# Patient Record
Sex: Female | Born: 1935 | Race: White | Hispanic: No | Marital: Married | Smoking: Never smoker
Health system: Southern US, Community
[De-identification: ages and names within clinical notes are randomized; demographics above are authoritative.]

## PROBLEM LIST (undated history)

## (undated) DIAGNOSIS — F0390 Unspecified dementia without behavioral disturbance: Secondary | ICD-10-CM

## (undated) DIAGNOSIS — C6932 Malignant neoplasm of left choroid: Secondary | ICD-10-CM

## (undated) DIAGNOSIS — I10 Essential (primary) hypertension: Secondary | ICD-10-CM

## (undated) DIAGNOSIS — I2510 Atherosclerotic heart disease of native coronary artery without angina pectoris: Secondary | ICD-10-CM

## (undated) DIAGNOSIS — E785 Hyperlipidemia, unspecified: Secondary | ICD-10-CM

## (undated) DIAGNOSIS — F325 Major depressive disorder, single episode, in full remission: Secondary | ICD-10-CM

## (undated) HISTORY — DX: Malignant neoplasm of left choroid: C69.32

## (undated) HISTORY — PX: JOINT REPLACEMENT: SHX530

## (undated) HISTORY — PX: CHOLECYSTECTOMY: SHX55

## (undated) HISTORY — DX: Hyperlipidemia, unspecified: E78.5

## (undated) HISTORY — DX: Atherosclerotic heart disease of native coronary artery without angina pectoris: I25.10

## (undated) HISTORY — DX: Unspecified dementia, unspecified severity, without behavioral disturbance, psychotic disturbance, mood disturbance, and anxiety: F03.90

## (undated) HISTORY — DX: Essential (primary) hypertension: I10

## (undated) HISTORY — DX: Major depressive disorder, single episode, in full remission: F32.5

---

## 2005-08-25 ENCOUNTER — Ambulatory Visit: Payer: Self-pay | Admitting: Internal Medicine

## 2005-08-26 ENCOUNTER — Ambulatory Visit: Payer: Self-pay

## 2005-09-06 ENCOUNTER — Ambulatory Visit: Payer: Self-pay

## 2006-06-28 ENCOUNTER — Other Ambulatory Visit: Payer: Self-pay

## 2006-06-28 ENCOUNTER — Emergency Department: Payer: Self-pay | Admitting: Emergency Medicine

## 2006-08-26 ENCOUNTER — Ambulatory Visit: Payer: Self-pay | Admitting: Internal Medicine

## 2006-08-30 ENCOUNTER — Ambulatory Visit: Payer: Self-pay | Admitting: Internal Medicine

## 2006-10-22 ENCOUNTER — Other Ambulatory Visit: Payer: Self-pay

## 2006-10-22 ENCOUNTER — Inpatient Hospital Stay: Payer: Self-pay | Admitting: Internal Medicine

## 2007-09-20 IMAGING — CR DG CHEST 1V PORT
1 series · 1 of 1 positions shown · non-contrast
Comparison: none

REASON FOR EXAM: Chest Pain
COMMENTS:

PROCEDURE:     DXR - DXR PORTABLE CHEST SINGLE VIEW  - June 28, 2006  [DATE]
RESULT:        A single portable view of the chest shows cardiac monitoring
electrodes are present.  The lungs are clear.  The heart and pulmonary
vessels are normal.  The bony and mediastinal structures are unremarkable.

[view not recorded]
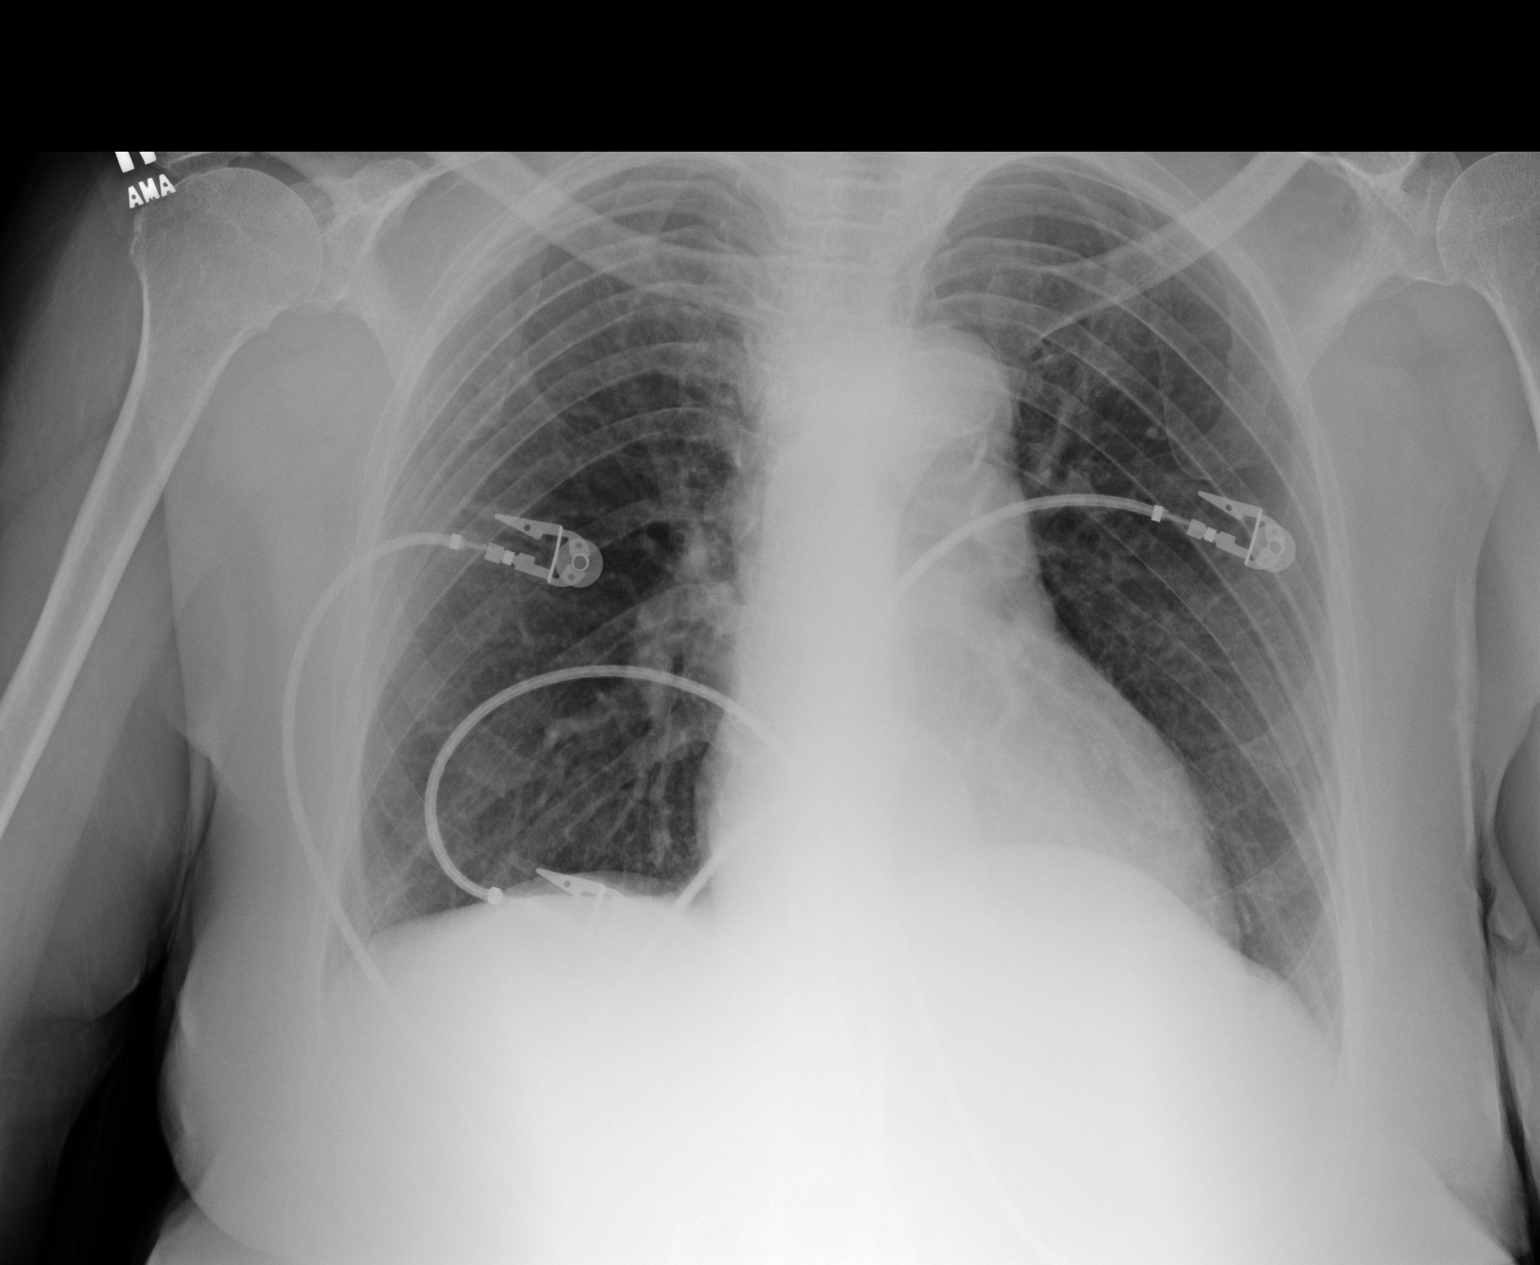

[1 of 1 positions shown; findings below may reference images not displayed]

IMPRESSION: No acute cardiopulmonary disease.

## 2007-11-22 IMAGING — US ULTRASOUND RIGHT BREAST
1 series · 17 of 25 positions shown · non-contrast
Comparison: none

REASON FOR EXAM: density  Poss US
COMMENTS:

[Series 1: ultrasound right breast · 17 of 44 slices shown]
[im 1/44]
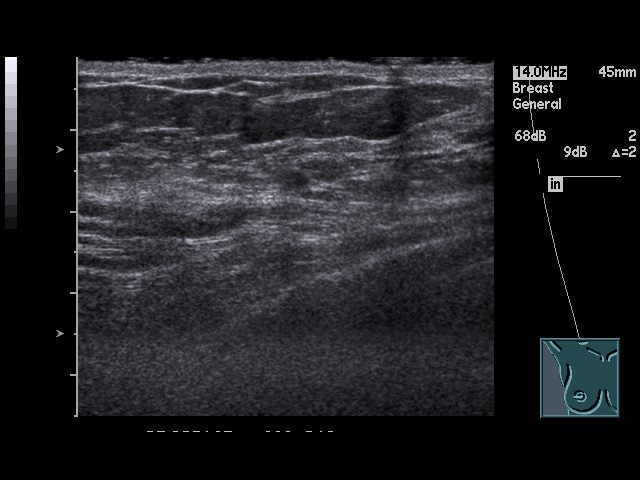
[im 4/44]
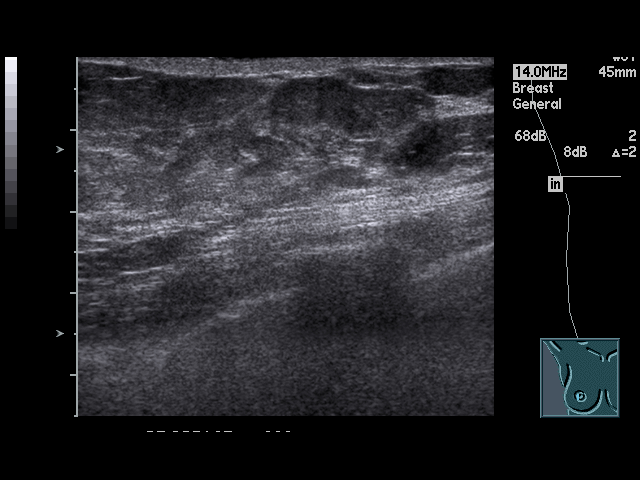
[im 6/44]
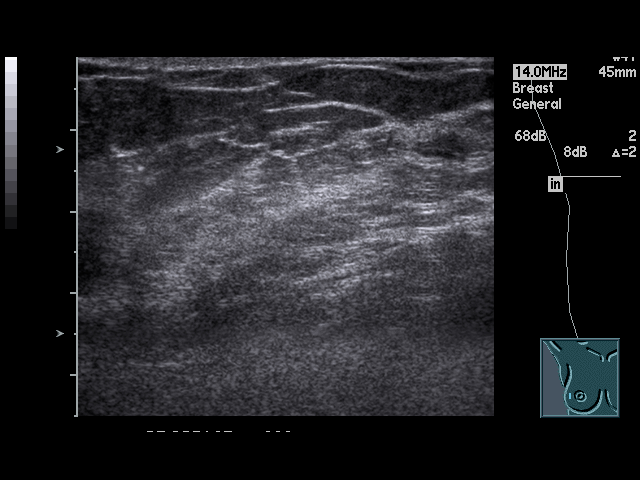
[im 9/44]
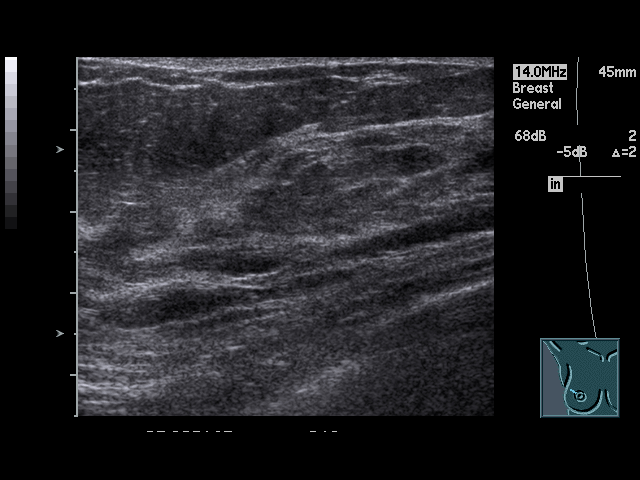
[im 11/44]
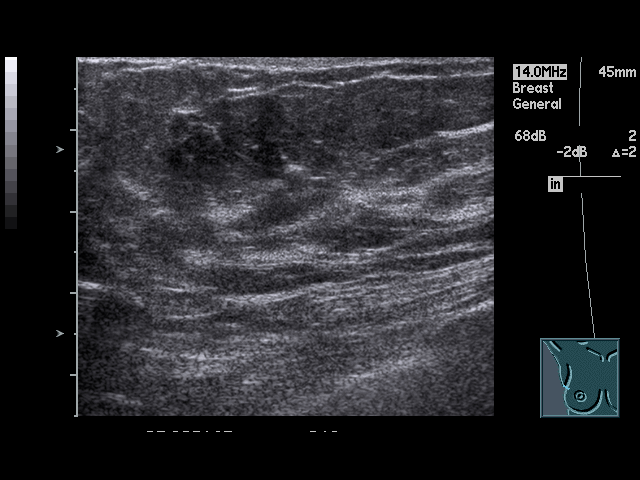
[im 15/44]
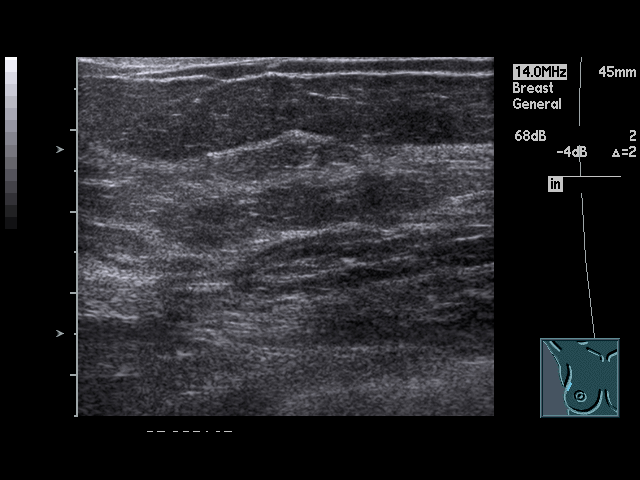
[im 17/44]
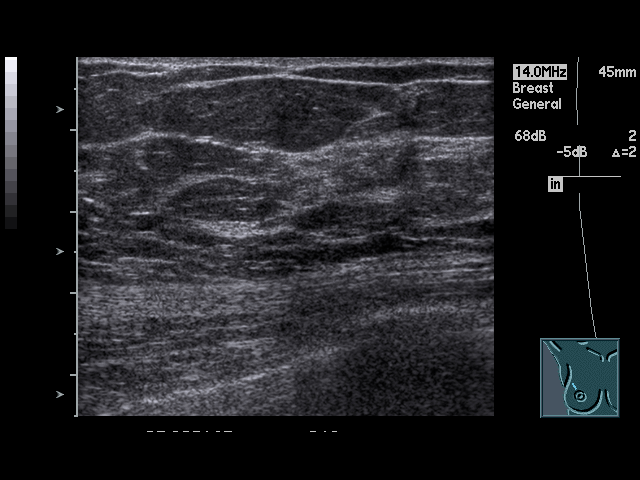
[im 20/44]
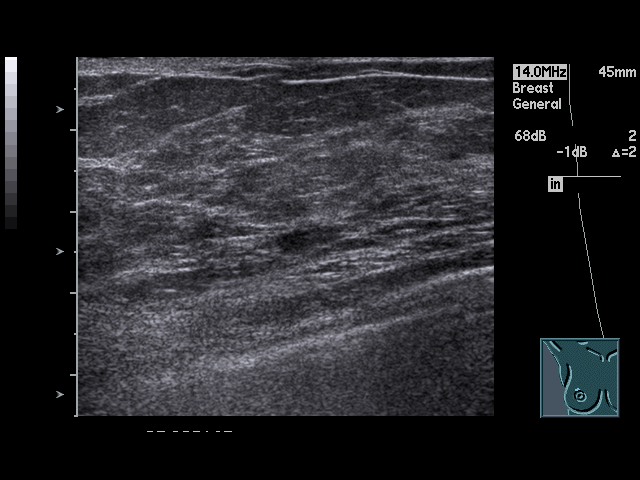
[im 22/44]
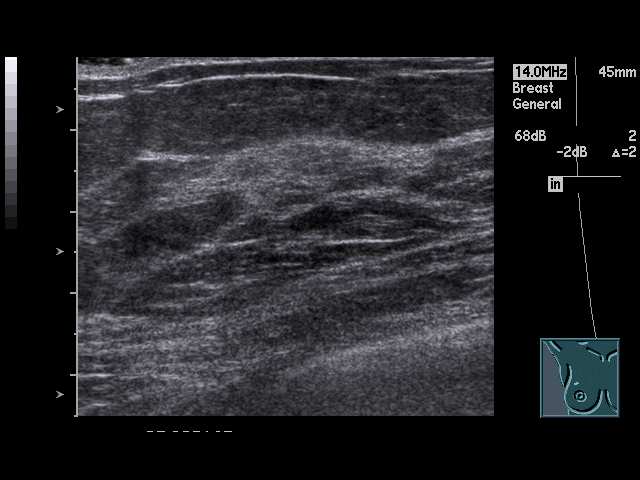
[im 24/44]
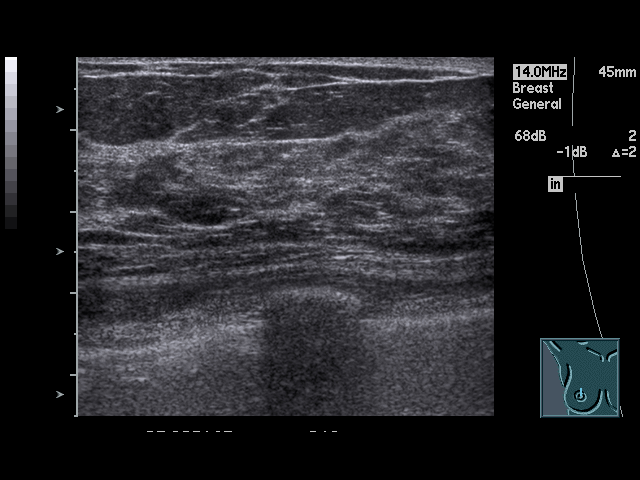
[im 27/44]
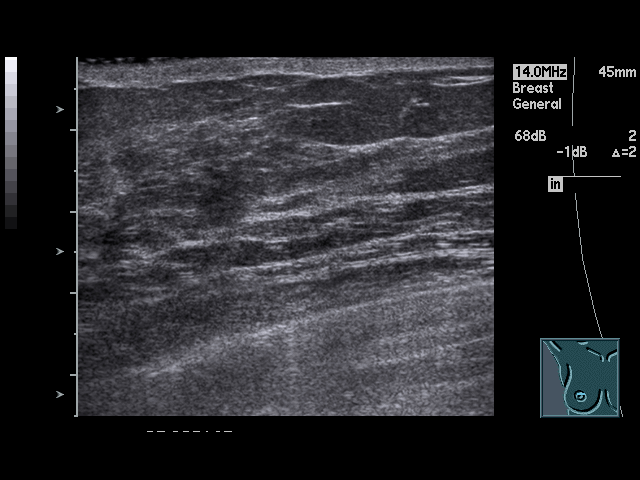
[im 29/44]
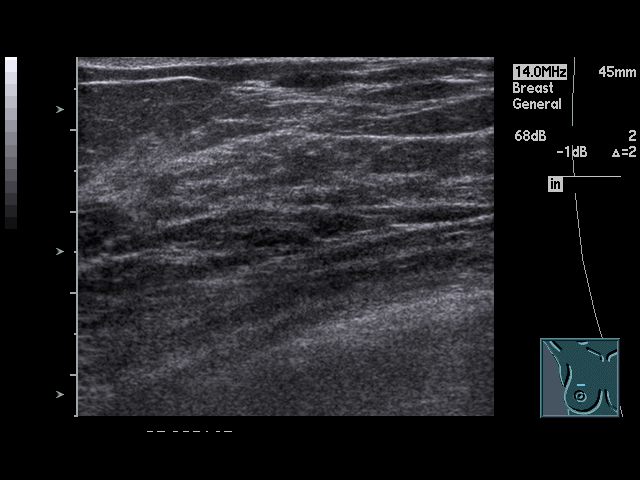
[im 33/44]
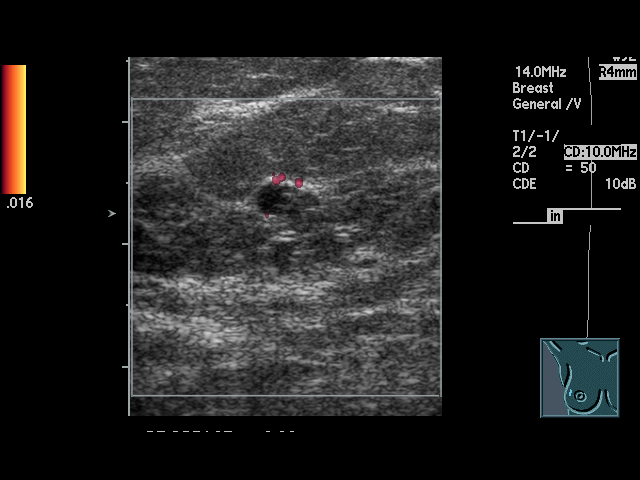
[im 35/44]
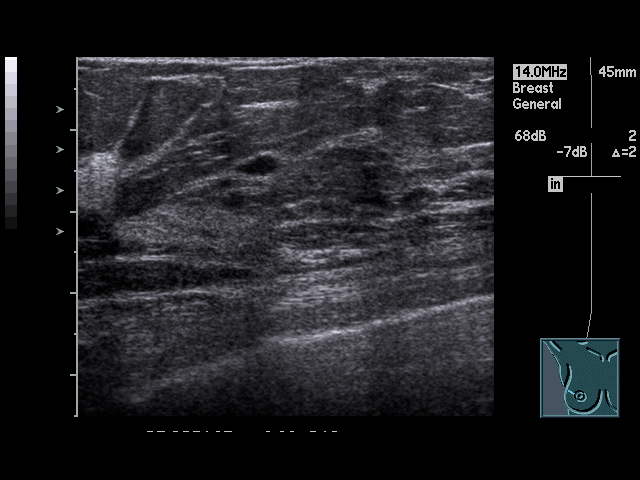
[im 38/44]
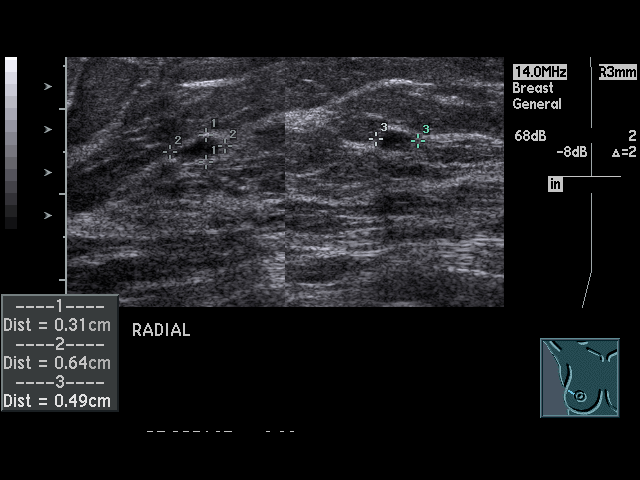
[im 40/44]
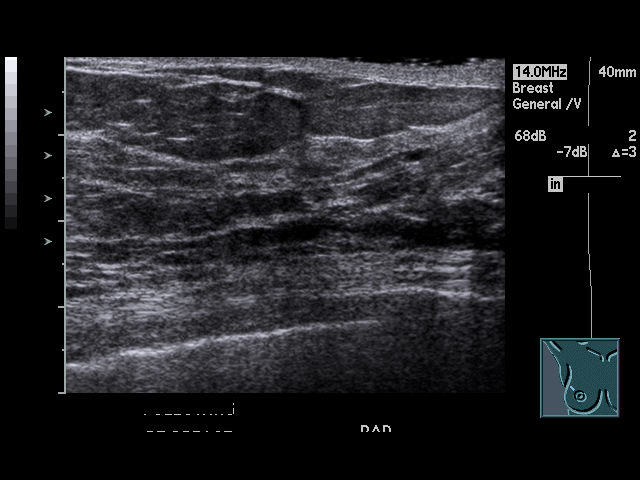
[im 44/44]
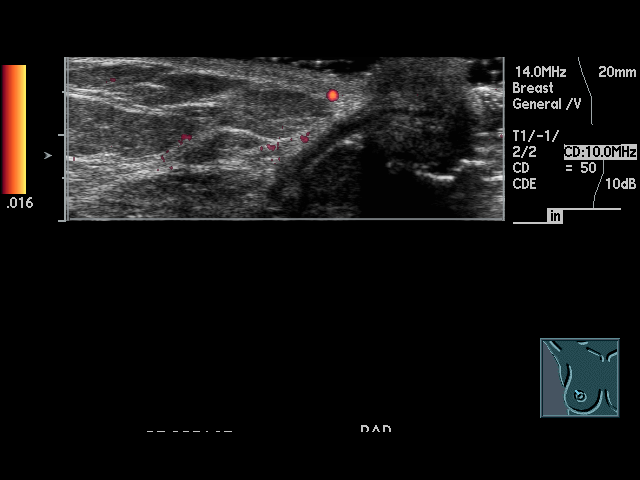

[17 of 25 positions shown; findings below may reference images not displayed]

PROCEDURE:     US  - US BREAST RIGHT  - August 30, 2006 [DATE]

RESULT:     The RIGHT breast was evaluated in the region of interest from
the 9 o'clock to 12 o'clock position. At the [DATE] position a hypoechoic
nodule is appreciated measuring .31 x .64 x .49 cm.  There are areas of
peripheral flow and mild wall irregularity.  There does appear to be an
element of mild acoustic shadowing. This nodule has suspicious ultrasound
findings and further evaluation with surgical consultation is recommended.
An area of what appears to be benign ductal ectasia is appreciated at the 10
o'clock position.
IMPRESSION: 1)Suspicious nodule within the region of interest at the [DATE] position of
the RIGHT breast, surgical consultation recommended.  Please refer to the
radiographic view dictation for completed discussion.

## 2008-03-18 ENCOUNTER — Observation Stay: Payer: Self-pay | Admitting: Specialist

## 2008-12-28 ENCOUNTER — Ambulatory Visit: Payer: Self-pay | Admitting: Internal Medicine

## 2009-03-14 ENCOUNTER — Ambulatory Visit: Payer: Self-pay | Admitting: Internal Medicine

## 2009-04-22 ENCOUNTER — Inpatient Hospital Stay: Payer: Self-pay | Admitting: Internal Medicine

## 2009-04-27 ENCOUNTER — Encounter: Payer: Self-pay | Admitting: Internal Medicine

## 2009-05-07 ENCOUNTER — Encounter: Payer: Self-pay | Admitting: Internal Medicine

## 2009-06-06 ENCOUNTER — Encounter: Payer: Self-pay | Admitting: Internal Medicine

## 2009-08-27 ENCOUNTER — Ambulatory Visit: Payer: Self-pay | Admitting: Internal Medicine

## 2009-08-27 ENCOUNTER — Inpatient Hospital Stay: Payer: Self-pay | Admitting: Internal Medicine

## 2009-12-27 ENCOUNTER — Inpatient Hospital Stay: Payer: Self-pay | Admitting: Internal Medicine

## 2010-02-19 ENCOUNTER — Ambulatory Visit: Payer: Self-pay | Admitting: Internal Medicine

## 2010-11-18 IMAGING — CR DG CHEST 1V PORT
1 series · 1 of 1 positions shown · non-contrast
Comparison: none

REASON FOR EXAM: chest pain
COMMENTS:

[view not recorded]
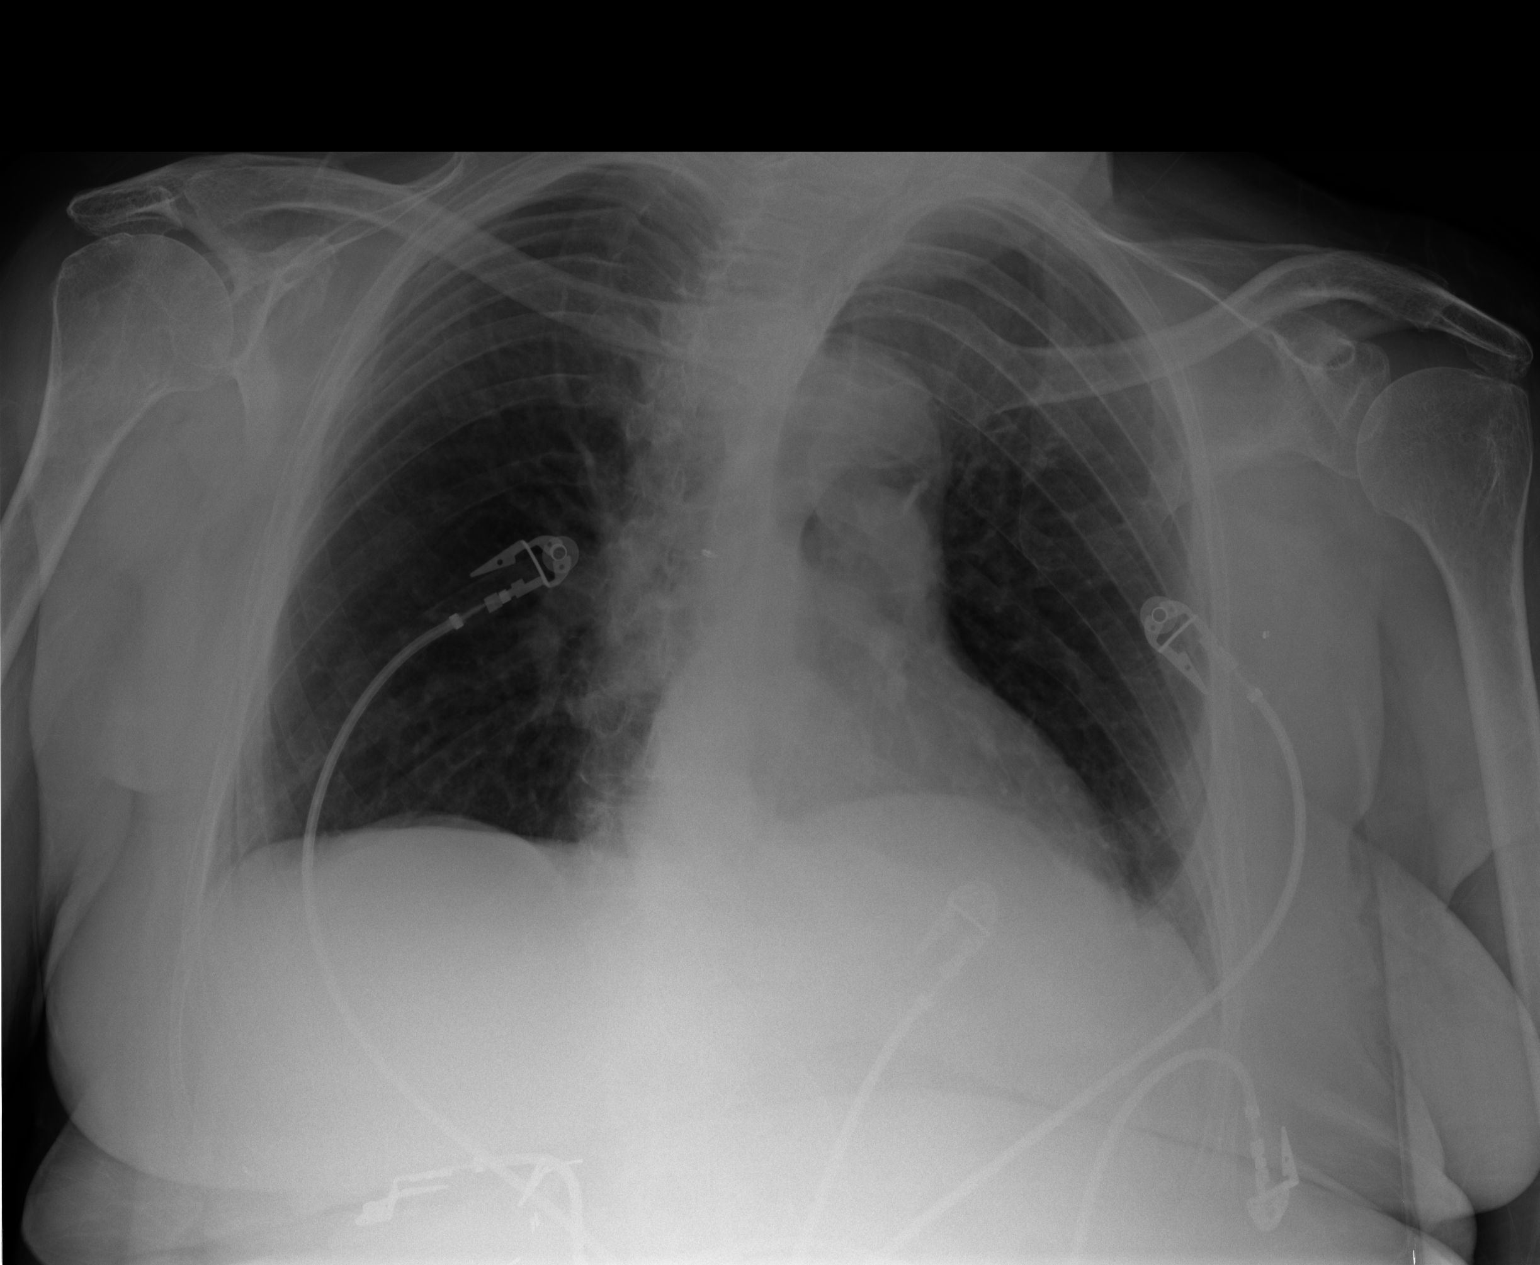

[1 of 1 positions shown; findings below may reference images not displayed]

PROCEDURE:     DXR - DXR PORTABLE CHEST SINGLE VIEW  - August 26, 2009 [DATE]

RESULT:     Comparison is made to prior study dated 04/22/2009.

The patient has taken a shallow inspiration. No focal regions of
consolidation are appreciated. The cardiac silhouette is within the upper
limits of normal. The visualized skeleton is unremarkable.
IMPRESSION: 1. Shallow inspiration without evidence of acute cardiopulmonary disease.

## 2011-08-11 ENCOUNTER — Other Ambulatory Visit: Payer: Self-pay | Admitting: *Deleted

## 2011-08-11 MED ORDER — INSULIN LISPRO 100 UNIT/ML ~~LOC~~ SOLN: SUBCUTANEOUS | Status: DC

## 2011-08-12 ENCOUNTER — Other Ambulatory Visit: Payer: Self-pay | Admitting: Internal Medicine

## 2011-08-12 MED ORDER — INSULIN LISPRO PROT & LISPRO (75-25 MIX) 100 UNIT/ML ~~LOC~~ SUSP: 38.0000 [IU] | Freq: Every day | SUBCUTANEOUS | Status: DC

## 2011-09-25 ENCOUNTER — Other Ambulatory Visit: Payer: Self-pay | Admitting: Internal Medicine

## 2011-09-25 MED ORDER — LORAZEPAM 0.5 MG PO TABS: 0.5000 mg | ORAL_TABLET | Freq: Two times a day (BID) | ORAL | Status: DC

## 2011-09-25 NOTE — Telephone Encounter (Signed)
Needs one Rx to go to [facility], one to go to [facility] and fax the one that was sent to [facility] to [facility] home at Fax # [phone]

## 2011-10-15 ENCOUNTER — Encounter: Payer: Self-pay | Admitting: Internal Medicine

## 2011-10-26 ENCOUNTER — Other Ambulatory Visit: Payer: Self-pay | Admitting: Internal Medicine

## 2012-02-29 ENCOUNTER — Telehealth: Payer: Self-pay | Admitting: *Deleted

## 2012-02-29 NOTE — Telephone Encounter (Signed)
Triage Record Num: [id number] Operator: Sula Rumple Patient Name: Theresa Hood Call Date & Time: 02/27/2012 6:01:04PM Patient Phone: [phone] PCP: Patient Gender: Female PCP Fax : Patient DOB: [birth year] Practice Name: [facility] Reason for Call: Caller: Fulton Reek; PCP: Duncan Dull; CB#: [phone]; Call regarding Blood sugar issues; Med Tech/Lucille calling from [facility] on 02/27/12 states pt had a BS of 109 @ 1730/ BS was 65 prior to dinner/was given Juice/peanut butter crackers/and BS increased to 109/ rechecked at 1804/MedTech is asking if pt should be given Novulin 70/30 16 at dinner per regular order/ BS at present is 150/pt has not eaten dinner/asking if she should give Insulin/advised to give medication as ordered and to recheck BS at HS/all emergent sx ruled out per Diabetes Control Problems Protocol Protocol(s) Used: Diabetes: Control Problems Recommended Outcome per Protocol: Provide Home/Self Care Reason for Outcome: Signs/symptoms of hypogylcemia AND responds to action plan as defined by provider Care Advice: Diabetes Action Plan: - Follow recommended action plan and diet - Check blood sugar as recommended - Take diabetic pills or insulin as ordered - Follow action plan for illness. ~ 03/

## 2012-03-14 ENCOUNTER — Telehealth: Payer: Self-pay | Admitting: Internal Medicine

## 2012-03-14 NOTE — Telephone Encounter (Signed)
[facility] was advised to call 911

## 2012-03-14 NOTE — Telephone Encounter (Signed)
I assume she was taken to the ER ?

## 2012-03-14 NOTE — Telephone Encounter (Signed)
[facility] Triage Call Report Triage Record Num: [id number] Operator: Arline Asp Loftin Patient Name: Theresa Hood Call Date & Time: 03/14/2012 2:24:24PM Patient Phone: [phone] PCP: Duncan Dull Patient Gender: Female PCP Fax : [phone] Patient DOB: [birth year] Practice Name: [facility] Reason for Call: Caller: Associate Professor with [facility]; PCP: Duncan Dull; CB#: [phone]; ; ; Call regarding [name] Calling From Assisted Living Said the Patient s Sugar Is High and She Is Dragging Her Right Leg. Pt's BG of 280 before lunch, took SS as ordered. BG has not been checked after lunch. Has been sleepier than her normal. Noticed dragging of her right leg on 04/08. Pt is a DNR. Advised to notify family, as per "Weakness/Paralysis Protocol" disp is 911. Will also recheck BG to make sure BG is less than 250 after her SS insulin. Protocol(s) Used: Weakness or Paralysis Recommended Outcome per Protocol: Activate EMS 911 Reason for Outcome: Acute onset of weakness or paralysis associated with loss of coordination (purposeful action) or numbness/tingling of any part of the body Care Advice: Write down provider's name. List or place the following in a bag for transport with the patient: current prescription and/or nonprescription medications; alternative treatments, therapies and medications; and street drugs. ~ 03/14/2012 2:32:59PM Page 1 of 1 [email or web address]

## 2012-03-15 NOTE — Telephone Encounter (Signed)
I called [facility] asking for Theresa Hood, a man answered who stated Theresa Hood was not available at this time and asked what I was calling about.  I said I wanted to make sure they called EMS on the patient yesterday, he yelled in the background and asked someone else if they did call and then he got back on the phone and stated they did not call EMS.   I asked him if Theresa Hood would call me back asap.

## 2012-03-15 NOTE — Telephone Encounter (Signed)
Please  tell them to make an appt with me then

## 2012-03-16 ENCOUNTER — Encounter: Payer: Self-pay | Admitting: Internal Medicine

## 2012-03-16 NOTE — Telephone Encounter (Signed)
Letter has been sent to [facility]

## 2012-03-16 NOTE — Telephone Encounter (Signed)
I called [facility] they can transport patient to an office visit next Wednesday.  Theresa Hood stated patient is doing much better, she stated they called her son and he came to see patient.  The only thing Theresa Hood wanted to know if you could take care of before patients appt Wednesday is to D/C Boost.  Theresa Hood stated she thinks that is what is raising patients blood sugar.  Please advise.

## 2012-03-16 NOTE — Telephone Encounter (Signed)
Yes, dc boost.  Glucerna is a good alternative.   Letter printed.

## 2012-03-23 ENCOUNTER — Ambulatory Visit: Payer: Self-pay | Admitting: Internal Medicine

## 2012-03-28 ENCOUNTER — Encounter: Payer: Self-pay | Admitting: Internal Medicine

## 2012-03-28 ENCOUNTER — Ambulatory Visit (INDEPENDENT_AMBULATORY_CARE_PROVIDER_SITE_OTHER): Payer: Medicare Other | Admitting: Internal Medicine

## 2012-03-28 VITALS — BP 132/76 | HR 80 | Temp 98.1°F | Resp 14 | Wt 164.5 lb

## 2012-03-28 DIAGNOSIS — E1122 Type 2 diabetes mellitus with diabetic chronic kidney disease: Secondary | ICD-10-CM | POA: Insufficient documentation

## 2012-03-28 DIAGNOSIS — F0390 Unspecified dementia without behavioral disturbance: Secondary | ICD-10-CM | POA: Insufficient documentation

## 2012-03-28 DIAGNOSIS — R5383 Other fatigue: Secondary | ICD-10-CM

## 2012-03-28 DIAGNOSIS — E785 Hyperlipidemia, unspecified: Secondary | ICD-10-CM

## 2012-03-28 DIAGNOSIS — I2510 Atherosclerotic heart disease of native coronary artery without angina pectoris: Secondary | ICD-10-CM

## 2012-03-28 DIAGNOSIS — N183 Chronic kidney disease, stage 3 unspecified: Secondary | ICD-10-CM | POA: Insufficient documentation

## 2012-03-28 DIAGNOSIS — E1165 Type 2 diabetes mellitus with hyperglycemia: Secondary | ICD-10-CM

## 2012-03-28 DIAGNOSIS — F325 Major depressive disorder, single episode, in full remission: Secondary | ICD-10-CM | POA: Insufficient documentation

## 2012-03-28 DIAGNOSIS — F068 Other specified mental disorders due to known physiological condition: Secondary | ICD-10-CM

## 2012-03-28 DIAGNOSIS — I10 Essential (primary) hypertension: Secondary | ICD-10-CM | POA: Insufficient documentation

## 2012-03-28 DIAGNOSIS — R5381 Other malaise: Secondary | ICD-10-CM

## 2012-03-28 LAB — COMPREHENSIVE METABOLIC PANEL
Albumin: 4.1 g/dL (ref 3.5–5.2)
BUN: 23 mg/dL (ref 6–23)
Chloride: 110 mEq/L (ref 96–112)
GFR: 31.68 mL/min — ABNORMAL LOW (ref >60.00)
Glucose, Bld: 105 mg/dL — ABNORMAL HIGH (ref 70–99)
Potassium: 3.9 mEq/L (ref 3.5–5.1)
Total Bilirubin: 0.2 mg/dL — ABNORMAL LOW (ref 0.3–1.2)
Total Protein: 7.1 g/dL (ref 6.0–8.3)

## 2012-03-28 LAB — HEMOGLOBIN A1C: Hgb A1c MFr Bld: 6.2 % (ref 4.6–6.5)

## 2012-03-28 NOTE — Patient Instructions (Signed)
We are switching yoru supplemental drink from glucerna to [employer]  Return in 3 months

## 2012-03-28 NOTE — Assessment & Plan Note (Signed)
Given her age and lost to followup I elected not last year to stop her statin therapy.

## 2012-03-28 NOTE — Assessment & Plan Note (Signed)
And with a markedly intact long-term memory and good preservation of social skills.. Continue Aricept.

## 2012-03-28 NOTE — Assessment & Plan Note (Addendum)
Managed with 70/30 insulin twice daily and short acting using sliding scale at lunchtime. A1c is due today. It is unclear why she is not on an ACE inhibitor given her history of coronary artery disease records have been requested from prior practice. She is now tolerating the Glucerna nutritional supplement. Will Will switch to [employer] breakfast drink for taste preference.

## 2012-03-28 NOTE — Progress Notes (Signed)
Patient ID: Theresa Hood, female   DOB: 08-10-1925, 78 y.o.   MRN: [id number]  Patient Active Problem List  Diagnoses  . Diabetes mellitus with stage 1 chronic kidney disease  . Hyperlipidemia  . Coronary artery disease  . Depression, major, in remission  . Dementia arising in the senium and presenium    Subjective:  CC:   Chief Complaint  Patient presents with  . Follow-up    HPI:   Theresa Hood is a 78 y.o. female who presents  For the first tinme in over two years for followup on diabetes, dementia and hyperlipidemia,  She has been lost to follow up since placement in A/L facility ([facility]) .   Recent reports of dragging her right leg and having uncontrolled blood sugars.  Was not taken to the Er since family evaluated her and found to be at her baseline.  Nutritional supplement was changed from boost to Glucerna  and she hates the taste of t. She denies headaches back pain and leg pain. He denies nausea but admits that her appetite is poor because she does not get hungry often and often skips meals. Her blood sugars are checked 3 times daily and her average morning blood sugar is 140,  her average pre-lunch blood sugar is averaging 145 and her average pre-dinner blood sugar is averaging 160 she has had no lows in the last 3 weeks. Her insulin regimen is 38 units of 70/30/ and in the morning with breakfast and 16 units with dinner she gets a additional 5 units at lunch of Humulin R for blood sugars greater than 150.   this has been necessary approximately 6 times in the last 3 weeks.    Past Medical History  Diagnosis Date  . Diabetes mellitus   . Hypertension   . Hyperlipidemia   . Coronary artery disease   . Melanoma, choroid, left eye     s/p enucleation  . Depression, major, in remission   . Dementia arising in the senium and presenium     Past Surgical History  Procedure Date  . Joint replacement     total left knee  . Cholecystectomy          The following  portions of the patient's history were reviewed and updated as appropriate: Allergies, current medications, and problem list.    Review of Systems:   12 Pt  review of systems was negative except those addressed in the HPI,     History   Social History  . Marital Status: Married    Spouse Name: N/A    Number of Children: N/A  . Years of Education: N/A   Occupational History  . Not on file.   Social History Main Topics  . Smoking status: Never Smoker   . Smokeless tobacco: Never Used  . Alcohol Use: No  . Drug Use: No  . Sexually Active: Not on file   Other Topics Concern  . Not on file   Social History Narrative  . No narrative on file    Objective:  BP 132/76  Pulse 80  Temp(Src) 98.1 F (36.7 C) (Oral)  Resp 14  Wt 164 lb 8 oz (74.617 kg)  SpO2 93%  General appearance: alert, cooperative and appears stated age Ears: normal TM's and external ear canals both ears Throat: lips, mucosa, and tongue normal; teeth and gums normal Neck: no adenopathy, no carotid bruit, supple, symmetrical, trachea midline and thyroid not enlarged, symmetric, no tenderness/mass/nodules Back: symmetric, no curvature.  ROM normal. No CVA tenderness. Lungs: clear to auscultation bilaterally Heart: regular rate and rhythm, S1, S2 normal, no murmur, click, rub or gallop Abdomen: soft, non-tender; bowel sounds normal; no masses,  no organomegaly Pulses: 2+ and symmetric Skin: Skin color, texture, turgor normal. No rashes or lesions Lymph nodes: Cervical, supraclavicular, and axillary nodes normal.  Assessment and Plan:  Coronary artery disease Asymptomatic. Continue Plavix aspirin statin.  Dementia arising in the senium and presenium And with a markedly intact long-term memory and good preservation of social skills.. Continue Aricept.  Diabetes mellitus with stage 1 chronic kidney disease Managed with 70/30 insulin twice daily and short acting using sliding scale at lunchtime. A1c  is due today. It is unclear why she is not on an ACE inhibitor given her history of coronary artery disease records have been requested from prior practice. She is now tolerating the Glucerna nutritional supplement. Will Will switch to [employer] breakfast drink for taste preference.  Hyperlipidemia Given her age and lost to followup I elected not last year to stop her statin therapy.    Updated Medication List Outpatient Encounter Prescriptions as of 03/28/2012  Medication Sig Dispense Refill  . acetaminophen (TYLENOL) 325 MG tablet Take 650 mg by mouth every 6 (six) hours as needed.      Marland Kitchen amoxicillin (AMOXIL) 500 MG capsule Take 2,000 mg by mouth. Prior to dental appts.      . benzonatate (TESSALON) 200 MG capsule Take 200 mg by mouth 3 (three) times daily as needed.      . clopidogrel (PLAVIX) 75 MG tablet Take 75 mg by mouth daily.      Marland Kitchen donepezil (ARICEPT) 10 MG tablet Take 10 mg by mouth at bedtime.      . ergocalciferol (VITAMIN D2) 50000 UNITS capsule Take 50,000 Units by mouth once a week.      . fexofenadine (ALLEGRA) 60 MG tablet Take 60 mg by mouth daily as needed.      Marland Kitchen guaifenesin (ROBITUSSIN) 100 MG/5ML syrup Take 10 mLs by mouth every 6 (six) hours as needed.      . insulin lispro (HUMALOG) 100 UNIT/ML injection Inject into the skin. Sliding Scale only lunch and supper:  200-300=10 units, 301-400=15 units, Greater than 400=20 units      . insulin NPH-insulin regular (NOVOLIN 70/30) (70-30) 100 UNIT/ML injection Inject 38 Units into the skin daily with breakfast. Inject 16 units with dinner      . loperamide (IMODIUM) 2 MG capsule Take 2 mg by mouth 4 (four) times daily as needed.      Marland Kitchen LORazepam (ATIVAN) 0.5 MG tablet Take 0.5 mg by mouth at bedtime.      . magnesium hydroxide (MILK OF MAGNESIA) 400 MG/5ML suspension Take 30 mLs by mouth daily as needed.      . meloxicam (MOBIC) 7.5 MG tablet Take 7.5 mg by mouth daily.      . mirtazapine (REMERON) 15 MG tablet Take  15 mg by mouth at bedtime.      Bertram Gala Glycol-Propyl Glycol (SYSTANE OP) Apply to eye.      . promethazine (PHENERGAN) 12.5 MG tablet Take 12.5 mg by mouth every 8 (eight) hours as needed.      . ranitidine (ZANTAC) 150 MG tablet Take 150 mg by mouth daily.      Marland Kitchen DISCONTD: LORazepam (ATIVAN) 0.5 MG tablet Take 1 tablet (0.5 mg total) by mouth 2 (two) times daily.  60 tablet  0  . DISCONTD:  feeding supplement (BOOST HIGH PROTEIN) LIQD Take 1 Container by mouth 2 (two) times daily.      Marland Kitchen DISCONTD: insulin lispro protamine-insulin lispro (HUMALOG 75/25) (75-25) 100 UNIT/ML SUSP Inject 38 Units into the skin daily with breakfast. 16 units with dinner.  10 mL  12     Orders Placed This Encounter  Procedures  . Comprehensive metabolic panel  . Hemoglobin A1c  . TSH  . Direct LDL    Return in about 3 months (around 06/27/2012).

## 2012-03-28 NOTE — Assessment & Plan Note (Signed)
Asymptomatic. Continue Plavix aspirin statin.

## 2012-03-29 LAB — TSH: TSH: 0.86 u[IU]/mL (ref 0.35–5.50)

## 2012-03-31 ENCOUNTER — Other Ambulatory Visit: Payer: Self-pay | Admitting: *Deleted

## 2012-04-01 ENCOUNTER — Telehealth: Payer: Self-pay | Admitting: Internal Medicine

## 2012-04-01 MED ORDER — LORAZEPAM 0.5 MG PO TABS: 0.5000 mg | ORAL_TABLET | Freq: Every day | ORAL | Status: DC

## 2012-04-01 NOTE — Telephone Encounter (Signed)
Have her try the other glucerna flavors.  If she doesn't like it ,  The facility will need to call the family and have them supply the carnation packets.

## 2012-04-01 NOTE — Telephone Encounter (Signed)
The Pharmacy called and stated they can not get Carn[employer] Breakfast drink because they do not carry it.  I advised the reason patient did not want Glucerna is because she did not like the taste.   The only options she has is patient can try the other flavors to see if she likes them better, or the family can provide the Carnation drinks instead.  They did not have an alternative for diabetic drink.  Please advise on what should be done.

## 2012-04-04 ENCOUNTER — Encounter: Payer: Self-pay | Admitting: Internal Medicine

## 2012-04-04 NOTE — Telephone Encounter (Signed)
Letter has been sent to [facility]

## 2012-04-07 ENCOUNTER — Other Ambulatory Visit: Payer: Self-pay | Admitting: Internal Medicine

## 2012-04-11 ENCOUNTER — Other Ambulatory Visit: Payer: Self-pay | Admitting: Internal Medicine

## 2012-04-11 MED ORDER — LORAZEPAM 0.5 MG PO TABS: 0.5000 mg | ORAL_TABLET | Freq: Every day | ORAL | Status: DC

## 2012-05-03 ENCOUNTER — Other Ambulatory Visit: Payer: Self-pay | Admitting: Internal Medicine

## 2012-05-03 MED ORDER — LOPERAMIDE HCL 2 MG PO CAPS: 4.0000 mg | ORAL_CAPSULE | ORAL | Status: DC | PRN

## 2012-09-22 ENCOUNTER — Ambulatory Visit (INDEPENDENT_AMBULATORY_CARE_PROVIDER_SITE_OTHER): Payer: Medicare Other | Admitting: Internal Medicine

## 2012-09-22 ENCOUNTER — Encounter: Payer: Self-pay | Admitting: Internal Medicine

## 2012-09-22 VITALS — BP 136/74 | HR 84 | Temp 98.7°F | Ht 64.5 in | Wt 155.5 lb

## 2012-09-22 DIAGNOSIS — R634 Abnormal weight loss: Secondary | ICD-10-CM

## 2012-09-22 DIAGNOSIS — R5383 Other fatigue: Secondary | ICD-10-CM

## 2012-09-22 DIAGNOSIS — E1129 Type 2 diabetes mellitus with other diabetic kidney complication: Secondary | ICD-10-CM

## 2012-09-22 DIAGNOSIS — E785 Hyperlipidemia, unspecified: Secondary | ICD-10-CM

## 2012-09-22 DIAGNOSIS — R350 Frequency of micturition: Secondary | ICD-10-CM

## 2012-09-22 DIAGNOSIS — Z23 Encounter for immunization: Secondary | ICD-10-CM

## 2012-09-22 DIAGNOSIS — E1122 Type 2 diabetes mellitus with diabetic chronic kidney disease: Secondary | ICD-10-CM

## 2012-09-22 DIAGNOSIS — E119 Type 2 diabetes mellitus without complications: Secondary | ICD-10-CM

## 2012-09-22 DIAGNOSIS — N181 Chronic kidney disease, stage 1: Secondary | ICD-10-CM

## 2012-09-22 LAB — COMPREHENSIVE METABOLIC PANEL
ALT: 10 U/L (ref 0–35)
BUN: 22 mg/dL (ref 6–23)
CO2: 27 mEq/L (ref 19–32)
Calcium: 9.2 mg/dL (ref 8.4–10.5)
Chloride: 107 mEq/L (ref 96–112)
Creatinine, Ser: 1.4 mg/dL — ABNORMAL HIGH (ref 0.4–1.2)
GFR: 38.62 mL/min — ABNORMAL LOW (ref >60.00)

## 2012-09-22 LAB — CBC WITH DIFFERENTIAL/PLATELET
Basophils Absolute: 0 10*3/uL (ref 0.0–0.1)
Basophils Relative: 0.4 % (ref 0.0–3.0)
Hemoglobin: 11.4 g/dL — ABNORMAL LOW (ref 12.0–15.0)
Lymphocytes Relative: 29 % (ref 12.0–46.0)
Monocytes Relative: 10.2 % (ref 3.0–12.0)
Neutro Abs: 3.6 10*3/uL (ref 1.4–7.7)
RBC: 4.87 Mil/uL (ref 3.87–5.11)

## 2012-09-22 LAB — HEMOGLOBIN A1C: Hgb A1c MFr Bld: 6.5 % (ref 4.6–6.5)

## 2012-09-22 NOTE — Progress Notes (Signed)
Patient ID: Theresa Hood, female   DOB: 1925/11/08, 78 y.o.   MRN: [id number]  Patient Active Problem List  Diagnosis  . Diabetes mellitus with stage 1 chronic kidney disease  . Hyperlipidemia  . Coronary artery disease  . Depression, major, in remission  . Dementia arising in the senium and presenium    Subjective:  CC:   Chief Complaint  Patient presents with  . Urinary Frequency    HPI:   Theresa Partinis a 78 y.o. female who presents for 6 month follow up on chronic problems, including insulin controlled diabetes and dementia.  She has been having urinary frequency, but denies burning.  She also  reports frequent insomnia., doesn't particularly like being at [facility] (has been there for years) .  He denies significant arm pain except arm whe.re flu shot was given.  Bowels moving regularly.  Does not participate in exercise because "I'm lazy."  No nausea,  Appetite ok.  UA and culture done yesterday at [facility].   Past Medical History  Diagnosis Date  . Diabetes mellitus   . Hypertension   . Hyperlipidemia   . Coronary artery disease   . Melanoma, choroid, left eye     s/p enucleation  . Depression, major, in remission   . Dementia arising in the senium and presenium     Past Surgical History  Procedure Date  . Joint replacement     total left knee  . Cholecystectomy          The following portions of the patient's history were reviewed and updated as appropriate: Allergies, current medications, and problem list.    Review of Systems:   12 Pt  review of systems was negative except those addressed in the HPI,     History   Social History  . Marital Status: Married    Spouse Name: N/A    Number of Children: N/A  . Years of Education: N/A   Occupational History  . Not on file.   Social History Main Topics  . Smoking status: Never Smoker   . Smokeless tobacco: Never Used  . Alcohol Use: No  . Drug Use: No  . Sexually Active: Not on file    Other Topics Concern  . Not on file   Social History Narrative  . No narrative on file    Objective:  BP 136/74  Pulse 84  Temp 98.7 F (37.1 C) (Oral)  Ht 5' 4.5" (1.638 m)  Wt 155 lb 8 oz (70.534 kg)  BMI 26.28 kg/m2  SpO2 97%  General appearance: alert, cooperative and appears stated age Ears: normal TM's and external ear canals both ears Throat: lips, mucosa, and tongue normal; teeth and gums normal Neck: no adenopathy, no carotid bruit, supple, symmetrical, trachea midline and thyroid not enlarged, symmetric, no tenderness/mass/nodules Back: symmetric, no curvature. ROM normal. No CVA tenderness. Lungs: clear to auscultation bilaterally Heart: regular rate and rhythm, S1, S2 normal, no murmur, click, rub or gallop Abdomen: soft, non-tender; bowel sounds normal; no masses,  no organomegaly Pulses: 2+ and symmetric Skin: Skin color, texture, turgor normal. No rashes or lesions Lymph nodes: Cervical, supraclavicular, and axillary nodes normal.  Assessment and Plan:  Diabetes mellitus with stage 1 chronic kidney disease Well controlled, with stable renal function.  No changes to regimen  today.   Hyperlipidemia LDL is 101 untreated.  Given her age there are no plans to treat.   Urinary frequency UA and culture drawn yesterday were negative for infection  Updated Medication List Outpatient Encounter Prescriptions as of 09/22/2012  Medication Sig Dispense Refill  . acetaminophen (TYLENOL) 325 MG tablet Take 650 mg by mouth every 6 (six) hours as needed.      Marland Kitchen amoxicillin (AMOXIL) 500 MG capsule Take 2,000 mg by mouth. Prior to dental appts.      . benzonatate (TESSALON) 200 MG capsule Take 200 mg by mouth 3 (three) times daily as needed.      . clopidogrel (PLAVIX) 75 MG tablet Take 75 mg by mouth daily.      Marland Kitchen donepezil (ARICEPT) 10 MG tablet Take 10 mg by mouth at bedtime.      . ergocalciferol (VITAMIN D2) 50000 UNITS capsule Take 50,000 Units by mouth once a  week.      . fexofenadine (ALLEGRA) 60 MG tablet Take 60 mg by mouth daily as needed.      Marland Kitchen guaifenesin (ROBITUSSIN) 100 MG/5ML syrup Take 10 mLs by mouth every 6 (six) hours as needed.      . insulin lispro (HUMALOG) 100 UNIT/ML injection Inject into the skin. Sliding Scale only lunch and supper:  200-300=10 units, 301-400=15 units, Greater than 400=20 units      . insulin NPH-insulin regular (NOVOLIN 70/30) (70-30) 100 UNIT/ML injection Inject 38 Units into the skin daily with breakfast. Inject 16 units with dinner      . loperamide (IMODIUM) 2 MG capsule Take 2 capsules (4 mg total) by mouth as needed for diarrhea or loose stools (max 6 tablets daily).  30 capsule  5  . LORazepam (ATIVAN) 0.5 MG tablet Take 1 tablet (0.5 mg total) by mouth at bedtime.  30 tablet  4  . magnesium hydroxide (MILK OF MAGNESIA) 400 MG/5ML suspension Take 30 mLs by mouth daily as needed.      . meloxicam (MOBIC) 7.5 MG tablet Take 7.5 mg by mouth daily.      . mirtazapine (REMERON) 15 MG tablet Take 15 mg by mouth at bedtime.      Bertram Gala Glycol-Propyl Glycol (SYSTANE OP) Apply to eye.      . promethazine (PHENERGAN) 12.5 MG tablet Take 12.5 mg by mouth every 8 (eight) hours as needed.      . ranitidine (ZANTAC) 150 MG tablet Take 150 mg by mouth daily.

## 2012-09-22 NOTE — Patient Instructions (Addendum)
You do not have a urinary tract infection. Your frequency is coming from having an 78 yr old bladder.  We could treat you with medications but they have a lot of side effects (confusion, dry mouth,  Constipation) so I do not advise.   You might try switching to caffein free Diet coke if not already doing so.   Return in 6 months

## 2012-09-25 ENCOUNTER — Encounter: Payer: Self-pay | Admitting: Internal Medicine

## 2012-09-25 DIAGNOSIS — R350 Frequency of micturition: Secondary | ICD-10-CM | POA: Insufficient documentation

## 2012-09-25 NOTE — Assessment & Plan Note (Signed)
Well controlled, with stable renal function.  No changes to regimen  today.

## 2012-09-25 NOTE — Assessment & Plan Note (Signed)
LDL is 101 untreated.  Given her age there are no plans to treat.

## 2012-09-25 NOTE — Assessment & Plan Note (Signed)
UA and culture drawn yesterday were negative for infection

## 2012-09-28 ENCOUNTER — Encounter: Payer: Self-pay | Admitting: Internal Medicine

## 2012-10-11 ENCOUNTER — Telehealth: Payer: Self-pay

## 2012-10-11 ENCOUNTER — Other Ambulatory Visit: Payer: Self-pay | Admitting: Internal Medicine

## 2012-10-11 NOTE — Telephone Encounter (Signed)
I see no order in the chart stating that I held her insulin, so I have no idea how they received that order. Nothing has been copied or saved to chart .  Please have them give her 10 units of novalog 100 and have them send me a copy of the order that supposedly suspended her insulin.

## 2012-10-11 NOTE — Telephone Encounter (Signed)
Theresa Hood from [facility] [place] stated that she received a order to hold until contacting Dr.Tullo, Her blood sugar right now is at 200. Please advise at 4:0 her normal check and she get novalin 730 and if she is over 200 she will go 10 units of novalog 100. Please respond.

## 2012-10-11 NOTE — Telephone Encounter (Signed)
She had an order not to give patient insulin before or after meal without consulting with Dr.Tullo.

## 2012-10-11 NOTE — Telephone Encounter (Signed)
Order faxed to [place] at [facility] place.

## 2012-10-12 ENCOUNTER — Telehealth: Payer: Self-pay

## 2012-10-12 NOTE — Telephone Encounter (Signed)
The blood sugars were dropping because of the 70/30,  Which has been stopped. She should only receive the sliding scale  Now before breakfast and dinner

## 2012-10-12 NOTE — Telephone Encounter (Addendum)
Theresa Hood called stated that she did receive your order for 10 units of Humalog  But it was DC because of insurance so patient is now on Novalog 100 as a slide scale. She wants to know if you can review everything before something drastic happens. She stated that what she is noticing that patient is not eating lunch and when she some in at 4 that is when her sugar is dropping sometimes and sometimes it is going up. She thinks that the sliding scale is fine its only a problem when she is not eating. She also stated that you can fax an order to have her blood sugar reading sent to you so you can get a better understanding on what is going on with patient.

## 2012-10-13 ENCOUNTER — Telehealth: Payer: Self-pay | Admitting: Internal Medicine

## 2012-10-13 ENCOUNTER — Other Ambulatory Visit: Payer: Self-pay | Admitting: Internal Medicine

## 2012-10-13 MED ORDER — INSULIN ASPART 100 UNIT/ML ~~LOC~~ SOLN: SUBCUTANEOUS | Status: DC

## 2012-10-13 NOTE — Telephone Encounter (Signed)
Caller: Janet/Other; [facility]; Patient Name: Theresa Hood; PCP: Duncan Dull (Adults only); Best Callback Phone Number: [phone]; Fatima Sanger calling and states that they received an order to discontinue  70/30 insulin and do BGMs TID  using  sliding scale  only with breakfast and dinner using Novolog; [facility] is questioning if the 5 units of Novolin R100  at 11:30am should still be given since the 70/30 insulin was discontinued on 10/12/12; OFFICE PLEASE REVIEW AND CALL FACILITY REGARDING IF PT SHOULD STILL BE RECEIVING NOVOLIN R100  AT 11:30AM DAILY; EPIC reviewed but unable to answer facility's question; pt is asymptomatic and denclined need for triage

## 2012-10-14 ENCOUNTER — Telehealth: Payer: Self-pay

## 2012-10-14 NOTE — Telephone Encounter (Signed)
Order faxed to [phone]

## 2012-10-14 NOTE — Telephone Encounter (Signed)
Theresa Hood called concerning patient blood sugar, she did receive your order, patient blood sugar was 349 at lunch time, she was given 15 units sliding scale she wants to know if she can get that order to specify if they can give her sliding scale at lunch as well.

## 2012-10-14 NOTE — Telephone Encounter (Signed)
Order faxed to [place] at [phone].

## 2012-10-14 NOTE — Telephone Encounter (Signed)
Yes, letter on printer

## 2012-10-17 ENCOUNTER — Telehealth: Payer: Self-pay

## 2012-10-17 NOTE — Telephone Encounter (Signed)
Eber Jones from [employer] called and stated that for state purpose she needs to have a letter stating that patient can use the same sliding scale for lunch and dinner.a new order is in the red folder that covers everything, all she needs is a signature.

## 2012-11-28 ENCOUNTER — Encounter: Payer: Self-pay | Admitting: Internal Medicine

## 2012-12-27 ENCOUNTER — Encounter: Payer: Self-pay | Admitting: Internal Medicine

## 2013-01-11 ENCOUNTER — Encounter: Payer: Self-pay | Admitting: Internal Medicine

## 2013-01-26 ENCOUNTER — Telehealth: Payer: Self-pay | Admitting: Internal Medicine

## 2013-01-26 MED ORDER — INSULIN ASPART 100 UNIT/ML ~~LOC~~ SOLN: SUBCUTANEOUS | Status: DC

## 2013-01-26 NOTE — Telephone Encounter (Signed)
This was faxed in

## 2013-02-24 ENCOUNTER — Telehealth: Payer: Self-pay | Admitting: Internal Medicine

## 2013-02-24 DIAGNOSIS — N390 Urinary tract infection, site not specified: Secondary | ICD-10-CM

## 2013-02-24 MED ORDER — CIPROFLOXACIN HCL 250 MG PO TABS: 250.0000 mg | ORAL_TABLET | Freq: Two times a day (BID) | ORAL | Status: DC

## 2013-02-24 NOTE — Telephone Encounter (Signed)
Per nurse Jola Babinski Cipro rx was faxed to [facility].

## 2013-02-24 NOTE — Telephone Encounter (Signed)
Patient has UTI Please call [facility] and determine if rx for cipro needs to be sent where.

## 2013-02-27 ENCOUNTER — Telehealth: Payer: Self-pay | Admitting: Internal Medicine

## 2013-03-31 ENCOUNTER — Telehealth: Payer: Self-pay | Admitting: Internal Medicine

## 2013-03-31 NOTE — Telephone Encounter (Signed)
Yes ,  I am aware.  thanks

## 2013-03-31 NOTE — Telephone Encounter (Signed)
Tobi Bastos called from [employer], they are had an issue with the stomach bug and the patient has continued to have loose stools, they have given her imodium prn. This has been going on all week with the patient. Imodium was given @ 10:25 and the loose stools have eased.  Just wanted to check in and make you aware of this issue. Please advise if needed.

## 2013-05-02 ENCOUNTER — Telehealth: Payer: Self-pay | Admitting: Internal Medicine

## 2013-05-02 DIAGNOSIS — E559 Vitamin D deficiency, unspecified: Secondary | ICD-10-CM

## 2013-05-02 DIAGNOSIS — E119 Type 2 diabetes mellitus without complications: Secondary | ICD-10-CM

## 2013-05-02 NOTE — Telephone Encounter (Signed)
Patient needs follow up visit for diabetes check up.  Labs ordered

## 2013-05-02 NOTE — Telephone Encounter (Signed)
Left message for patient to call office.  

## 2013-05-05 NOTE — Telephone Encounter (Signed)
Tried to reach patient again today phone has been temporarily disconnected will try on Monday

## 2013-05-08 NOTE — Telephone Encounter (Signed)
Left message on home phone to call office cell phone is wrong number.

## 2013-05-10 ENCOUNTER — Other Ambulatory Visit (INDEPENDENT_AMBULATORY_CARE_PROVIDER_SITE_OTHER): Payer: Medicare Other

## 2013-05-10 DIAGNOSIS — E559 Vitamin D deficiency, unspecified: Secondary | ICD-10-CM

## 2013-05-10 DIAGNOSIS — E119 Type 2 diabetes mellitus without complications: Secondary | ICD-10-CM

## 2013-05-10 LAB — COMPREHENSIVE METABOLIC PANEL
AST: 17 U/L (ref 0–37)
Alkaline Phosphatase: 68 U/L (ref 39–117)
BUN: 23 mg/dL (ref 6–23)
Creatinine, Ser: 1.5 mg/dL — ABNORMAL HIGH (ref 0.4–1.2)
Glucose, Bld: 204 mg/dL — ABNORMAL HIGH (ref 70–99)
Total Bilirubin: 0.3 mg/dL (ref 0.3–1.2)

## 2013-05-10 LAB — MICROALBUMIN / CREATININE URINE RATIO
Creatinine,U: 52.5 mg/dL
Microalb, Ur: 5.4 mg/dL — ABNORMAL HIGH (ref 0.0–1.9)

## 2013-05-10 LAB — HEMOGLOBIN A1C: Hgb A1c MFr Bld: 8.5 % — ABNORMAL HIGH (ref 4.6–6.5)

## 2013-05-10 LAB — TSH: TSH: 0.85 u[IU]/mL (ref 0.35–5.50)

## 2013-05-11 LAB — VITAMIN D 25 HYDROXY (VIT D DEFICIENCY, FRACTURES): Vit D, 25-Hydroxy: 33 ng/mL (ref 30–89)

## 2013-05-16 NOTE — Telephone Encounter (Signed)
Called appointment made for DM follow up, was advised by nurse for patient has broken area to left buttock  Of 0.5 cm please advise.

## 2013-05-16 NOTE — Telephone Encounter (Signed)
If she can't describe it in more detail than that as an Char[employer] fundraiser, patietn will have to be seen.

## 2013-05-16 NOTE — Telephone Encounter (Signed)
[employer], was not able to give better description will refer her to the RN there part time at facility and have her evaluate.

## 2013-05-17 ENCOUNTER — Encounter: Payer: Self-pay | Admitting: *Deleted

## 2013-05-22 ENCOUNTER — Encounter: Payer: Self-pay | Admitting: Internal Medicine

## 2013-05-22 ENCOUNTER — Ambulatory Visit (INDEPENDENT_AMBULATORY_CARE_PROVIDER_SITE_OTHER): Payer: Medicare Other | Admitting: Internal Medicine

## 2013-05-22 VITALS — BP 126/60 | HR 91 | Temp 98.8°F | Resp 16 | Wt 157.8 lb

## 2013-05-22 DIAGNOSIS — E1129 Type 2 diabetes mellitus with other diabetic kidney complication: Secondary | ICD-10-CM

## 2013-05-22 DIAGNOSIS — E1122 Type 2 diabetes mellitus with diabetic chronic kidney disease: Secondary | ICD-10-CM

## 2013-05-22 DIAGNOSIS — N183 Chronic kidney disease, stage 3 unspecified: Secondary | ICD-10-CM

## 2013-05-22 DIAGNOSIS — L8990 Pressure ulcer of unspecified site, unspecified stage: Secondary | ICD-10-CM

## 2013-05-22 DIAGNOSIS — L89159 Pressure ulcer of sacral region, unspecified stage: Secondary | ICD-10-CM

## 2013-05-22 DIAGNOSIS — L89109 Pressure ulcer of unspecified part of back, unspecified stage: Secondary | ICD-10-CM

## 2013-05-22 NOTE — Assessment & Plan Note (Addendum)
Loss of control,  a1c 8.5 ,   Insulin 70/30 bid  Doses amended upon review of facility bllod sugar log.  Doses of  12/10  currenlty have been increased to 14 units in the AM and 12 units in the PM .  Stopping meloxicam given GFR < 50

## 2013-05-22 NOTE — Progress Notes (Signed)
Patient ID: Theresa Hood, female   DOB: June 24, 1925, 79 y.o.   MRN: [id number]    SUBJECTIVE: 79 y.o. female for follow up of diabetes. Diabetic Review of Systems - medication compliance: compliant all of the time with twice daily 70/30 insulin and lunchtime SSI using regular insulin,, diabetic diet compliance: compliant most of the time, limited by the menu at [facility], the assisted living facility at which she resides, home glucose monitoring: is performed three times daily regularly, last eye exam approximately 6 months ago.  Other symptoms and concerns. None.  But hse is having pain on her left buttock near the natal cleft and staff at [facility] had requested cream for pressure ulcer without prior evaluation, so she was brought in today for evaluation. .  Current Outpatient Prescriptions  Medication Sig Dispense Refill  . acetaminophen (TYLENOL) 325 MG tablet Take 650 mg by mouth every 6 (six) hours as needed.      Marland Kitchen amoxicillin (AMOXIL) 500 MG capsule Take 2,000 mg by mouth. Prior to dental appts.      . clopidogrel (PLAVIX) 75 MG tablet Take 75 mg by mouth daily.      Marland Kitchen donepezil (ARICEPT) 10 MG tablet Take 10 mg by mouth at bedtime.      . ergocalciferol (VITAMIN D2) 50000 UNITS capsule Take 50,000 Units by mouth once a week.      . insulin aspart (NOVOLOG) 100 UNIT/ML injection Inject 14 units daily before breakfast  3 vial  PRN  . LORazepam (ATIVAN) 0.5 MG tablet Take 1 tablet (0.5 mg total) by mouth at bedtime.  30 tablet  4  . magnesium hydroxide (MILK OF MAGNESIA) 400 MG/5ML suspension Take 30 mLs by mouth daily as needed.      . mirtazapine (REMERON) 15 MG tablet Take 15 mg by mouth at bedtime.      . ranitidine (ZANTAC) 150 MG tablet Take 150 mg by mouth daily.      . benzonatate (TESSALON) 200 MG capsule Take 200 mg by mouth 3 (three) times daily as needed.      . ciprofloxacin (CIPRO) 250 MG tablet Take 1 tablet (250 mg total) by mouth 2 (two) times daily.  6 tablet  0  .  fexofenadine (ALLEGRA) 60 MG tablet Take 60 mg by mouth daily as needed.      Marland Kitchen guaifenesin (ROBITUSSIN) 100 MG/5ML syrup Take 10 mLs by mouth every 6 (six) hours as needed.      . loperamide (IMODIUM) 2 MG capsule Take 2 capsules (4 mg total) by mouth as needed for diarrhea or loose stools (max 6 tablets daily).  30 capsule  5  . meloxicam (MOBIC) 7.5 MG tablet Take 7.5 mg by mouth daily.      Bertram Gala Glycol-Propyl Glycol (SYSTANE OP) Apply to eye.      . promethazine (PHENERGAN) 12.5 MG tablet Take 12.5 mg by mouth every 8 (eight) hours as needed.       No current facility-administered medications for this visit.    OBJECTIVE: Appearance: alert, well appearing, and in no distress, oriented to person, place, and time and normal appearing weight. BP 126/60  Pulse 91  Temp(Src) 98.8 F (37.1 C) (Oral)  Resp 16  Wt 157 lb 12 oz (71.555 kg)  BMI 26.67 kg/m2  SpO2 96%  Exam: heart sounds normal rate, regular rhythm, normal S1, S2, no murmurs, rubs, clicks or gallops, chest clear, no hepatosplenomegaly, no carotid bruits, feet: warm, good capillary refill Skin: healing sacral ulcer  on left natal cleft , minimal erythema no current drainage noted on underwear.   Assessment and Plan  Type 2 diabetes mellitus with stage 3 chronic kidney disease Loss of control,  a1c 8.5 ,   Insulin 70/30 bid  Doses amended upon review of facility bllod sugar log.  Doses of  12/10  currenlty have been increased to 14 units in the AM and 12 units in the PM   Sacral pressure ulcer stage 2.  Current appearance suggests that the ulcer started as a boil,  Now resolved with minimal erythema and no signs of currently draining. Since she reports pain with pressure to area,  I have written rx for "doughnut" pillow to sit on to offload area and prn Septra for recurrent signs of infection . Cover with absorbent dressing.      Updated Medication List Outpatient Encounter Prescriptions as of 05/22/2013  Medication  Sig Dispense Refill  . acetaminophen (TYLENOL) 325 MG tablet Take 650 mg by mouth every 6 (six) hours as needed.      Marland Kitchen amoxicillin (AMOXIL) 500 MG capsule Take 2,000 mg by mouth. Prior to dental appts.      . clopidogrel (PLAVIX) 75 MG tablet Take 75 mg by mouth daily.      Marland Kitchen donepezil (ARICEPT) 10 MG tablet Take 10 mg by mouth at bedtime.      . ergocalciferol (VITAMIN D2) 50000 UNITS capsule Take 50,000 Units by mouth once a week.      . insulin aspart (NOVOLOG) 100 UNIT/ML injection Inject 14 units daily before breakfast  3 vial  PRN  . LORazepam (ATIVAN) 0.5 MG tablet Take 1 tablet (0.5 mg total) by mouth at bedtime.  30 tablet  4  . magnesium hydroxide (MILK OF MAGNESIA) 400 MG/5ML suspension Take 30 mLs by mouth daily as needed.      . mirtazapine (REMERON) 15 MG tablet Take 15 mg by mouth at bedtime.      . ranitidine (ZANTAC) 150 MG tablet Take 150 mg by mouth daily.      . benzonatate (TESSALON) 200 MG capsule Take 200 mg by mouth 3 (three) times daily as needed.      . ciprofloxacin (CIPRO) 250 MG tablet Take 1 tablet (250 mg total) by mouth 2 (two) times daily.  6 tablet  0  . fexofenadine (ALLEGRA) 60 MG tablet Take 60 mg by mouth daily as needed.      Marland Kitchen guaifenesin (ROBITUSSIN) 100 MG/5ML syrup Take 10 mLs by mouth every 6 (six) hours as needed.      . loperamide (IMODIUM) 2 MG capsule Take 2 capsules (4 mg total) by mouth as needed for diarrhea or loose stools (max 6 tablets daily).  30 capsule  5  . meloxicam (MOBIC) 7.5 MG tablet Take 7.5 mg by mouth daily.      Bertram Gala Glycol-Propyl Glycol (SYSTANE OP) Apply to eye.      . promethazine (PHENERGAN) 12.5 MG tablet Take 12.5 mg by mouth every 8 (eight) hours as needed.       No facility-administered encounter medications on file as of 05/22/2013.

## 2013-05-23 LAB — HM DIABETES EYE EXAM: HM Diabetic Eye Exam: NORMAL

## 2013-05-23 NOTE — Assessment & Plan Note (Signed)
stage 2.  Current appearance suggests that the ulcer started as a boil,  Now resolved with minimal erythema and no signs of currently draining. Since she reports pain with pressure to area,  I have written rx for "doughnut" pillow to sit on to offload area and prn Septra for recurrent signs of infection . Cover with absorbent dressing.

## 2013-06-20 ENCOUNTER — Encounter: Payer: Self-pay | Admitting: Internal Medicine

## 2013-06-21 ENCOUNTER — Encounter: Payer: Self-pay | Admitting: Internal Medicine

## 2013-06-29 ENCOUNTER — Encounter: Payer: Self-pay | Admitting: Internal Medicine

## 2013-07-07 ENCOUNTER — Other Ambulatory Visit: Payer: Self-pay | Admitting: Internal Medicine

## 2013-07-07 MED ORDER — TRAMADOL HCL 50 MG PO TABS: 50.0000 mg | ORAL_TABLET | Freq: Every day | ORAL | Status: DC | PRN

## 2013-08-01 ENCOUNTER — Other Ambulatory Visit: Payer: Self-pay | Admitting: Internal Medicine

## 2013-08-01 DIAGNOSIS — E1122 Type 2 diabetes mellitus with diabetic chronic kidney disease: Secondary | ICD-10-CM

## 2013-08-01 MED ORDER — INSULIN ASPART 100 UNIT/ML ~~LOC~~ SOLN: SUBCUTANEOUS | Status: DC

## 2013-08-01 NOTE — Assessment & Plan Note (Signed)
Recurrent episodes of mid morning hypoglycemia reported. Reduce the morning dose of insulin to 10 units q. breakfast

## 2013-08-09 ENCOUNTER — Other Ambulatory Visit: Payer: Self-pay | Admitting: Internal Medicine

## 2013-08-09 MED ORDER — TRAMADOL HCL 50 MG PO TABS: 50.0000 mg | ORAL_TABLET | Freq: Every day | ORAL | Status: DC | PRN

## 2013-08-28 ENCOUNTER — Telehealth: Payer: Self-pay | Admitting: Internal Medicine

## 2013-08-28 MED ORDER — CIPROFLOXACIN HCL 250 MG PO TABS: 250.0000 mg | ORAL_TABLET | Freq: Two times a day (BID) | ORAL | Status: DC

## 2013-08-28 NOTE — Telephone Encounter (Signed)
Facility notified of results and documentation faxed along with script . [facility] [employer] notified.

## 2013-08-28 NOTE — Telephone Encounter (Signed)
Ms. Flam has  UTI that should resolved to ciprofloxacin.  I have printed an rx.

## 2013-09-07 ENCOUNTER — Telehealth: Payer: Self-pay | Admitting: Internal Medicine

## 2013-09-07 ENCOUNTER — Encounter: Payer: Self-pay | Admitting: Internal Medicine

## 2013-09-07 NOTE — Telephone Encounter (Signed)
States they have a new mental health program in the community and would like to send pt there for an eval due to some recent changes in cognition.  States the mental health provider will consult with primary care regarding the pt.  Theresa Hood states you can call her with any questions or just fax the order over for the referral for mental health.

## 2013-09-08 NOTE — Telephone Encounter (Signed)
Letter on printer

## 2013-09-11 NOTE — Telephone Encounter (Signed)
Letter faxed to facility.

## 2013-09-14 ENCOUNTER — Telehealth: Payer: Self-pay | Admitting: Internal Medicine

## 2013-09-18 ENCOUNTER — Other Ambulatory Visit: Payer: Self-pay | Admitting: Internal Medicine

## 2013-09-18 MED ORDER — INSULIN NPH ISOPHANE & REGULAR (70-30) 100 UNIT/ML ~~LOC~~ SUSP: 16.0000 [IU] | Freq: Every day | SUBCUTANEOUS | Status: DC

## 2013-09-19 ENCOUNTER — Encounter: Payer: Self-pay | Admitting: *Deleted

## 2013-09-19 ENCOUNTER — Ambulatory Visit: Payer: Medicare Other | Admitting: Internal Medicine

## 2013-09-20 ENCOUNTER — Encounter: Payer: Self-pay | Admitting: Internal Medicine

## 2013-09-20 ENCOUNTER — Ambulatory Visit (INDEPENDENT_AMBULATORY_CARE_PROVIDER_SITE_OTHER): Payer: Medicare Other | Admitting: Internal Medicine

## 2013-09-20 VITALS — BP 146/70 | HR 70 | Temp 98.8°F | Resp 14 | Wt 150.5 lb

## 2013-09-20 DIAGNOSIS — E1129 Type 2 diabetes mellitus with other diabetic kidney complication: Secondary | ICD-10-CM

## 2013-09-20 DIAGNOSIS — N952 Postmenopausal atrophic vaginitis: Secondary | ICD-10-CM | POA: Insufficient documentation

## 2013-09-20 DIAGNOSIS — E1122 Type 2 diabetes mellitus with diabetic chronic kidney disease: Secondary | ICD-10-CM

## 2013-09-20 MED ORDER — ESTROGENS, CONJUGATED 0.625 MG/GM VA CREA: TOPICAL_CREAM | VAGINAL | Status: DC

## 2013-09-20 NOTE — Assessment & Plan Note (Addendum)
Lunchtime low of 70 noted today.  Reduce morning dose of 70/30 insulin started yesterday at 16 untis  to 12 units. hemoglobin A1c was elevated at over 9.  She is up-to-date on eye exams and his foot exam is normal.  She has  proteinuria by today's urine test.

## 2013-09-22 NOTE — Progress Notes (Signed)
Patient ID: Theresa Hood, female   DOB: 08-Feb-1925, 79 y.o.   MRN: [id number]  Patient Active Problem List   Diagnosis Date Noted  . Vaginitis, atrophic 09/20/2013  . Sacral pressure ulcer 05/22/2013  . Urinary frequency 09/25/2012  . Type 2 diabetes mellitus with stage 3 chronic kidney disease   . Hyperlipidemia   . Coronary artery disease   . Depression, major, in remission   . Dementia arising in the senium and presenium     Subjective:  CC:   Chief Complaint  Patient presents with  . vaginal pain and burning    x1 month    HPI:   Theresa Hood a 79 y.o. female who presents for followup on acute and chronic Issues.   patient is accompanied by a driver from the assisted-living facility. She states she has had chronic vaginal pain on and off for over a year. She states she said the pain even since childhood. She has had her urine tested recently and was negative for signs of infection. She is not sexually active. She is postmenopausal.  2) diabetes followup. Patient's last A1c was over 9 and her insulin regimen was adjusted. She has a history of hypoglycemic events due to dementia  And occasional anorexia complicated by a less a staff at [facility] that cannot modify insulin orders without permission. She was receiving sliding scale in the morning and at lunch with 70/30 insulin in the evening.  Yesterday I started  70/30 insulin in the morning since she was having elevated lunchtime blood sugars per review of data supplied by staff. The caregiver that accompanies her today reports that she had a low blood sugar of 50 Reppert for lunch today. This is not recorded in the brought over. Patient is not aware that she has diabetes.   Past Medical History  Diagnosis Date  . Diabetes mellitus   . Hypertension   . Hyperlipidemia   . Coronary artery disease   . Melanoma, choroid, left eye     s/p enucleation  . Depression, major, in remission   . Dementia arising in the senium and  presenium     Past Surgical History  Procedure Laterality Date  . Joint replacement      total left knee  . Cholecystectomy         The following portions of the patient's history were reviewed and updated as appropriate: Allergies, current medications, and problem list.    Review of Systems:   12 Pt  review of systems was negative except those addressed in the HPI,     History   Social History  . Marital Status: Married    Spouse Name: N/A    Number of Children: N/A  . Years of Education: N/A   Occupational History  . Not on file.   Social History Main Topics  . Smoking status: Never Smoker   . Smokeless tobacco: Never Used  . Alcohol Use: No  . Drug Use: No  . Sexual Activity: Not on file   Other Topics Concern  . Not on file   Social History Narrative  . No narrative on file    Objective:  Filed Vitals:   09/20/13 1433  BP: 146/70  Pulse: 70  Temp: 98.8 F (37.1 C)  Resp: 14    General Appearance:    Alert, cooperative, no distress, appears stated age  Head:    Normocephalic, without obvious abnormality, atraumatic  Eyes:    PERRL, conjunctiva/corneas clear, EOM's intact, fundi  benign, both eyes  Ears:    Normal TM's and external ear canals, both ears  Nose:   Nares normal, septum midline, mucosa normal, no drainage    or sinus tenderness  Throat:   Lips, mucosa, and tongue normal; teeth and gums normal  Neck:   Supple, symmetrical, trachea midline, no adenopathy;    thyroid:  no enlargement/tenderness/nodules; no carotid   bruit or JVD  Back:     Symmetric, no curvature, ROM normal, no CVA tenderness  Lungs:     Clear to auscultation bilaterally, respirations unlabored  Chest Wall:    No tenderness or deformity   Heart:    Regular rate and rhythm, S1 and S2 normal, no murmur, rub   or gallop  Breast Exam:    No tenderness, masses, or nipple abnormality  Abdomen:     Soft, non-tender, bowel sounds active all four quadrants,    no  masses, no organomegaly  Genitalia:    Pelvic: cervix normal in appearance, external genitalia normal, no adnexal masses or tenderness, no cervical motion tenderness, rectovaginal septum normal, uterus normal size, shape, and consistency and vagina pale and atrophic  Extremities:   Extremities normal, atraumatic, no cyanosis or edema  Pulses:   2+ and symmetric all extremities  Skin:   Skin color, texture, turgor normal, no rashes or lesions  Lymph nodes:   Cervical, supraclavicular, and axillary nodes normal  Neurologic:   CNII-XII intact, normal strength, sensation and reflexes    throughout   Assessment and Plan:  Type 2 diabetes mellitus with stage 3 chronic kidney disease Lunchtime low of 70 noted today.  Reduce morning dose of 70/30 insulin to 12 units  Vaginitis, atrophic Pelvic exam today shows very pale mucosa. No signs of lichen sclerosis. Trial of Premarin cream.   Updated Medication List Outpatient Encounter Prescriptions as of 09/20/2013  Medication Sig Dispense Refill  . acetaminophen (TYLENOL) 325 MG tablet Take 650 mg by mouth every 6 (six) hours as needed.      Marland Kitchen amoxicillin (AMOXIL) 500 MG capsule Take 2,000 mg by mouth. Prior to dental appts.      . benzonatate (TESSALON) 200 MG capsule Take 200 mg by mouth 3 (three) times daily as needed.      . ciprofloxacin (CIPRO) 250 MG tablet Take 1 tablet (250 mg total) by mouth 2 (two) times daily.  6 tablet  0  . clopidogrel (PLAVIX) 75 MG tablet Take 75 mg by mouth daily.      Marland Kitchen conjugated estrogens (PREMARIN) vaginal cream Insert 1 gram into vagina each night for two weeks,  Then reduce use to twice weekly  42.5 g  12  . donepezil (ARICEPT) 10 MG tablet Take 10 mg by mouth at bedtime.      . ergocalciferol (VITAMIN D2) 50000 UNITS capsule Take 50,000 Units by mouth once a week.      . fexofenadine (ALLEGRA) 60 MG tablet Take 60 mg by mouth daily as needed.      Marland Kitchen guaifenesin (ROBITUSSIN) 100 MG/5ML syrup Take 10 mLs by  mouth every 6 (six) hours as needed.      . insulin NPH-regular (NOVOLIN 70/30) (70-30) 100 UNIT/ML injection Inject 16 Units into the skin daily with breakfast. And 10 units at dinner  10 mL  12  . loperamide (IMODIUM) 2 MG capsule Take 2 capsules (4 mg total) by mouth as needed for diarrhea or loose stools (max 6 tablets daily).  30 capsule  5  .  LORazepam (ATIVAN) 0.5 MG tablet Take 1 tablet (0.5 mg total) by mouth at bedtime.  30 tablet  4  . magnesium hydroxide (MILK OF MAGNESIA) 400 MG/5ML suspension Take 30 mLs by mouth daily as needed.      . mirtazapine (REMERON) 15 MG tablet Take 15 mg by mouth at bedtime.      Bertram Gala Glycol-Propyl Glycol (SYSTANE OP) Apply to eye.      . promethazine (PHENERGAN) 12.5 MG tablet Take 12.5 mg by mouth every 8 (eight) hours as needed.      . ranitidine (ZANTAC) 150 MG tablet Take 150 mg by mouth daily.      . traMADol (ULTRAM) 50 MG tablet Take 1 tablet (50 mg total) by mouth daily as needed for pain.  30 tablet  5   No facility-administered encounter medications on file as of 09/20/2013.     Orders Placed This Encounter  Procedures  . HM DIABETES EYE EXAM    No Follow-up on file.

## 2013-09-22 NOTE — Assessment & Plan Note (Signed)
Pelvic exam today shows very pale mucosa. No signs of lichen sclerosis. Trial of Premarin cream.

## 2013-09-25 ENCOUNTER — Telehealth: Payer: Self-pay | Admitting: *Deleted

## 2013-09-25 NOTE — Telephone Encounter (Signed)
Theresa Hood called to notify additional hours of care approved for patient to be moved to memory care unit this just for FYI. Patient is being moved today.

## 2013-09-26 ENCOUNTER — Other Ambulatory Visit: Payer: Self-pay | Admitting: Internal Medicine

## 2013-10-03 ENCOUNTER — Telehealth: Payer: Self-pay | Admitting: Internal Medicine

## 2013-10-03 NOTE — Telephone Encounter (Signed)
Please advise 

## 2013-10-03 NOTE — Telephone Encounter (Signed)
Have had a cold going around in the facility and Theresa Hood is now showing symptoms, runny nose and congestion.  Asking if something can be prescribed.  States most other pts at the facility with the cold have been taking Robitussin.

## 2013-10-03 NOTE — Telephone Encounter (Signed)
What happened to the "prn" list they have?  Robitussin is available OTC,  Does not need a script.  please confirm

## 2013-10-11 ENCOUNTER — Other Ambulatory Visit: Payer: Self-pay | Admitting: Internal Medicine

## 2013-10-11 MED ORDER — INSULIN NPH ISOPHANE & REGULAR (70-30) 100 UNIT/ML ~~LOC~~ SUSP: 14.0000 [IU] | Freq: Every day | SUBCUTANEOUS | Status: DC

## 2013-10-11 NOTE — Progress Notes (Signed)
Form and Rx were faxed to facility

## 2013-10-18 ENCOUNTER — Other Ambulatory Visit: Payer: Self-pay | Admitting: Internal Medicine

## 2013-10-18 MED ORDER — INSULIN NPH ISOPHANE & REGULAR (70-30) 100 UNIT/ML ~~LOC~~ SUSP: 17.0000 [IU] | Freq: Every day | SUBCUTANEOUS | Status: DC

## 2013-10-30 ENCOUNTER — Telehealth: Payer: Self-pay | Admitting: Internal Medicine

## 2013-10-30 MED ORDER — INSULIN NPH ISOPHANE & REGULAR (70-30) 100 UNIT/ML ~~LOC~~ SUSP: 20.0000 [IU] | Freq: Every day | SUBCUTANEOUS | Status: DC

## 2013-12-20 ENCOUNTER — Telehealth: Payer: Self-pay | Admitting: Internal Medicine

## 2013-12-20 NOTE — Telephone Encounter (Signed)
Patient confused and has back pain reported by [facility] who says we ignore everytime they have a problem and need UA results.

## 2013-12-20 NOTE — Telephone Encounter (Signed)
States she has left two previous vm.  Refused to leave msg today, stated she wanted to speak directly with nurse.  Advised nurse in room with pt.  Aldona Bar is asking for a call regarding UA and culture that was faxed to [place] this past weekend.  States they have not received a response.

## 2013-12-21 MED ORDER — CIPROFLOXACIN HCL 250 MG PO TABS: 250.0000 mg | ORAL_TABLET | Freq: Two times a day (BID) | ORAL | Status: DC

## 2013-12-21 MED ORDER — CIPROFLOXACIN HCL 250 MG PO TABS
250.0000 mg | ORAL_TABLET | Freq: Two times a day (BID) | ORAL | Status: DC
Start: 2013-12-21 — End: 2014-02-12

## 2013-12-21 NOTE — Telephone Encounter (Signed)
cipro 250 mg bid sent to [facility].  .  If it needs to go to facility ,send the one I  print out.  Please forward to [facility] and tell her we are still getting many voicemails that are not getting routed. .  SOMEHTING HAS TO BE DONE !!!!!

## 2013-12-21 NOTE — Telephone Encounter (Signed)
Advised tech Aldona Bar) to please send symptoms and request for UA before running so we are aware . Phones are currently being worked on.

## 2013-12-25 ENCOUNTER — Other Ambulatory Visit: Payer: Self-pay | Admitting: Internal Medicine

## 2013-12-25 NOTE — Telephone Encounter (Signed)
Ok refill? 

## 2013-12-29 NOTE — Telephone Encounter (Signed)
Ok to refill,  Refill sent  

## 2014-01-16 ENCOUNTER — Encounter: Payer: Self-pay | Admitting: Internal Medicine

## 2014-01-31 ENCOUNTER — Other Ambulatory Visit: Payer: Self-pay | Admitting: Internal Medicine

## 2014-02-05 ENCOUNTER — Other Ambulatory Visit: Payer: Self-pay | Admitting: Internal Medicine

## 2014-02-05 MED ORDER — TRAMADOL HCL 50 MG PO TABS: 50.0000 mg | ORAL_TABLET | Freq: Three times a day (TID) | ORAL | Status: DC

## 2014-02-05 NOTE — Progress Notes (Unsigned)
Orders and script faxed as requested.

## 2014-02-09 ENCOUNTER — Other Ambulatory Visit: Payer: Self-pay | Admitting: Internal Medicine

## 2014-02-09 ENCOUNTER — Ambulatory Visit: Payer: Medicare Other | Admitting: Internal Medicine

## 2014-02-09 DIAGNOSIS — N390 Urinary tract infection, site not specified: Secondary | ICD-10-CM

## 2014-02-09 MED ORDER — CIPROFLOXACIN HCL 250 MG PO TABS: 250.0000 mg | ORAL_TABLET | Freq: Two times a day (BID) | ORAL | Status: DC

## 2014-02-12 ENCOUNTER — Ambulatory Visit (INDEPENDENT_AMBULATORY_CARE_PROVIDER_SITE_OTHER): Payer: Medicare Other | Admitting: Internal Medicine

## 2014-02-12 ENCOUNTER — Encounter: Payer: Self-pay | Admitting: General Surgery

## 2014-02-12 ENCOUNTER — Encounter: Payer: Self-pay | Admitting: Internal Medicine

## 2014-02-12 ENCOUNTER — Encounter: Payer: Self-pay | Admitting: Emergency Medicine

## 2014-02-12 VITALS — BP 138/76 | HR 69 | Temp 97.4°F | Resp 18 | Wt 161.5 lb

## 2014-02-12 DIAGNOSIS — E1165 Type 2 diabetes mellitus with hyperglycemia: Principal | ICD-10-CM

## 2014-02-12 DIAGNOSIS — E1129 Type 2 diabetes mellitus with other diabetic kidney complication: Secondary | ICD-10-CM

## 2014-02-12 DIAGNOSIS — IMO0001 Reserved for inherently not codable concepts without codable children: Secondary | ICD-10-CM

## 2014-02-12 DIAGNOSIS — N183 Chronic kidney disease, stage 3 unspecified: Secondary | ICD-10-CM

## 2014-02-12 DIAGNOSIS — E039 Hypothyroidism, unspecified: Secondary | ICD-10-CM

## 2014-02-12 DIAGNOSIS — N390 Urinary tract infection, site not specified: Secondary | ICD-10-CM

## 2014-02-12 DIAGNOSIS — K649 Unspecified hemorrhoids: Secondary | ICD-10-CM

## 2014-02-12 DIAGNOSIS — K648 Other hemorrhoids: Secondary | ICD-10-CM

## 2014-02-12 DIAGNOSIS — F0390 Unspecified dementia without behavioral disturbance: Secondary | ICD-10-CM

## 2014-02-12 DIAGNOSIS — E1122 Type 2 diabetes mellitus with diabetic chronic kidney disease: Secondary | ICD-10-CM

## 2014-02-12 LAB — CBC WITH DIFFERENTIAL/PLATELET
Basophils Absolute: 0 10*3/uL (ref 0.0–0.1)
Basophils Relative: 0.2 % (ref 0.0–3.0)
Eosinophils Absolute: 0.1 10*3/uL (ref 0.0–0.7)
Eosinophils Relative: 2 % (ref 0.0–5.0)
HCT: 35.4 % — ABNORMAL LOW (ref 36.0–46.0)
Hemoglobin: 10.9 g/dL — ABNORMAL LOW (ref 12.0–15.0)
Lymphocytes Relative: 27.5 % (ref 12.0–46.0)
Lymphs Abs: 1.7 10*3/uL (ref 0.7–4.0)
MCHC: 31 g/dL (ref 30.0–36.0)
MCV: 77.1 fl — ABNORMAL LOW (ref 78.0–100.0)
MONO ABS: 0.6 10*3/uL (ref 0.1–1.0)
MONOS PCT: 9.6 % (ref 3.0–12.0)
Neutro Abs: 3.7 10*3/uL (ref 1.4–7.7)
Neutrophils Relative %: 60.7 % (ref 43.0–77.0)
Platelets: 177 10*3/uL (ref 150.0–400.0)
RBC: 4.59 Mil/uL (ref 3.87–5.11)
RDW: 16.4 % — AB (ref 11.5–14.6)
WBC: 6.2 10*3/uL (ref 4.5–10.5)

## 2014-02-12 LAB — COMPREHENSIVE METABOLIC PANEL
ALT: 15 U/L (ref 0–35)
AST: 21 U/L (ref 0–37)
Albumin: 3.9 g/dL (ref 3.5–5.2)
Alkaline Phosphatase: 56 U/L (ref 39–117)
BILIRUBIN TOTAL: 0.4 mg/dL (ref 0.3–1.2)
BUN: 27 mg/dL — ABNORMAL HIGH (ref 6–23)
CO2: 24 mEq/L (ref 19–32)
CREATININE: 1.3 mg/dL — AB (ref 0.4–1.2)
Calcium: 9.1 mg/dL (ref 8.4–10.5)
Chloride: 107 mEq/L (ref 96–112)
GFR: 41.98 mL/min — AB (ref >60.00)
GLUCOSE: 103 mg/dL — AB (ref 70–99)
Potassium: 3.8 mEq/L (ref 3.5–5.1)
Sodium: 141 mEq/L (ref 135–145)
Total Protein: 7 g/dL (ref 6.0–8.3)

## 2014-02-12 LAB — HEMOGLOBIN A1C: Hgb A1c MFr Bld: 7.5 % — ABNORMAL HIGH (ref 4.6–6.5)

## 2014-02-12 LAB — TSH: TSH: 1.69 u[IU]/mL (ref 0.35–5.50)

## 2014-02-12 LAB — HM DIABETES FOOT EXAM: HM DIABETIC FOOT EXAM: NORMAL

## 2014-02-12 LAB — PROTIME-INR
INR: 1.1 ratio — ABNORMAL HIGH (ref 0.8–1.0)
Prothrombin Time: 11.1 s (ref 10.2–12.4)

## 2014-02-12 MED ORDER — HYDROCORTISONE 2.5 % RE CREA
1.0000 | TOPICAL_CREAM | Freq: Two times a day (BID) | RECTAL | Status: DC
Start: 2014-02-12 — End: 2017-10-16

## 2014-02-12 NOTE — Progress Notes (Signed)
Pre-visit discussion using our clinic review tool. No additional management support is needed unless otherwise documented below in the visit note.  

## 2014-02-12 NOTE — Assessment & Plan Note (Signed)
With recurrent discofort and bleeding,  Stiz baths,  anusol HC bid, referral to [facility] for definitive treatment.

## 2014-02-12 NOTE — Assessment & Plan Note (Signed)
Overdue for A1c,  Lives in A/L facility that does not have a carb controlled menu for diabetic patiennts,.  Will continue 70/30 insulin ,  20 units in AM and lower the evening dose fro n16 to 14 units.  Foot exam normal ,  Annual eye exam advised,

## 2014-02-12 NOTE — Progress Notes (Signed)
Patient ID: Theresa Hood, female   DOB: 10/16/1925, 80 y.o.   MRN: [id number] Patient Active Problem List   Diagnosis Date Noted  . Hemorrhoid prolapse 02/12/2014  . UTI (urinary tract infection) 02/09/2014  . Vaginitis, atrophic 09/20/2013  . Urinary frequency 09/25/2012  . Type 2 diabetes mellitus with stage 3 chronic kidney disease   . Hyperlipidemia   . Coronary artery disease   . Depression, major, in remission   . Dementia arising in the senium and presenium     Subjective:  CC:   Chief Complaint  Patient presents with  . Follow-up    follow therapy    HPI:   Theresa Hood is a 80 y.o. female who presents for 3 month follow up on chronic conditions including DM Type 2,  Dementia,  Frequent UTIS.  Patient ambulating very little without assistance, uses a walker ,  Cc today is hemorrhoids bothering her repeatedly.  Denies constipation,  Has seen some blood on toilet paper.   Brought in by son Eduard Clos  DM: facility checking BS three times daily   3 of 60 readings are > 200, 1 under 80  Occurring fasting .     Past Medical History  Diagnosis Date  . Diabetes mellitus   . Hypertension   . Hyperlipidemia   . Coronary artery disease   . Melanoma, choroid, left eye     s/p enucleation  . Depression, major, in remission   . Dementia arising in the senium and presenium     Past Surgical History  Procedure Laterality Date  . Joint replacement      total left knee  . Cholecystectomy         The following portions of the patient's history were reviewed and updated as appropriate: Allergies, current medications, and problem list.    Review of Systems:   Patient denies headache, fevers, malaise, unintentional weight loss, skin rash, eye pain, sinus congestion and sinus pain, sore throat, dysphagia,  hemoptysis , cough, dyspnea, wheezing, chest pain, palpitations, orthopnea, edema, abdominal pain, nausea, melena, diarrhea, constipation, flank pain, dysuria, hematuria,  urinary  Frequency, nocturia, numbness, tingling, seizures,  Focal weakness, Loss of consciousness,  Tremor, insomnia, depression, anxiety, and suicidal ideation.     History   Social History  . Marital Status: Married    Spouse Name: N/A    Number of Children: N/A  . Years of Education: N/A   Occupational History  . Not on file.   Social History Main Topics  . Smoking status: Never Smoker   . Smokeless tobacco: Never Used  . Alcohol Use: No  . Drug Use: No  . Sexual Activity: Not on file   Other Topics Concern  . Not on file   Social History Narrative  . No narrative on file    Objective:  Filed Vitals:   02/12/14 1109  BP: 138/76  Pulse: 69  Temp: 97.4 F (36.3 C)  Resp: 18     General appearance: alert, cooperative and appears stated age Ears: normal TM's and external ear canals both ears Throat: lips, mucosa, and tongue normal; teeth and gums normal Neck: no adenopathy, no carotid bruit, supple, symmetrical, trachea midline and thyroid not enlarged, symmetric, no tenderness/mass/nodules Back: symmetric, no curvature. ROM normal. No CVA tenderness. Lungs: clear to auscultation bilaterally Heart: regular rate and rhythm, S1, S2 normal, no murmur, click, rub or gallop Abdomen: soft, non-tender; bowel sounds normal; no masses,  no organomegaly Pulses: 2+ and symmetric Skin: Skin  color, texture, turgor normal. No rashes or lesions Lymph nodes: Cervical, supraclavicular, and axillary nodes normal. Foot exam:  Nails are well trimmed,  No callouses,  Sensation intact to microfilament Rectal: large protuberant  Enflamed hemorrhoid prolapsed  Assessment and Plan:  Type 2 diabetes mellitus with stage 3 chronic kidney disease Overdue for A1c,  Lives in A/L facility that does not have a carb controlled menu for diabetic patiennts,.  Will continue 70/30 insulin ,  20 units in AM and lower the evening dose fro n16 to 14 units.  Foot exam normal ,  Annual eye exam  advised,   UTI (urinary tract infection) Recurrent secondary to atrophic vaginitis and dementia.   Hemorrhoid prolapse With recurrent discofort and bleeding,  Stiz baths,  anusol HC bid, referral to [facility] for definitive treatment.    Updated Medication List Outpatient Encounter Prescriptions as of 02/12/2014  Medication Sig  . acetaminophen (TYLENOL) 325 MG tablet Take 650 mg by mouth every 6 (six) hours as needed.  Marland Kitchen amoxicillin (AMOXIL) 500 MG capsule Take 2,000 mg by mouth. Prior to dental appts.  . benzonatate (TESSALON) 200 MG capsule Take 200 mg by mouth 3 (three) times daily as needed.  . ciprofloxacin (CIPRO) 250 MG tablet Take 1 tablet (250 mg total) by mouth 2 (two) times daily.  . clopidogrel (PLAVIX) 75 MG tablet Take 75 mg by mouth daily.  Marland Kitchen conjugated estrogens (PREMARIN) vaginal cream Insert 1 gram into vagina each night for two weeks,  Then reduce use to twice weekly  . donepezil (ARICEPT) 10 MG tablet Take 10 mg by mouth at bedtime.  . fexofenadine (ALLEGRA) 60 MG tablet Take 60 mg by mouth daily as needed.  Marland Kitchen guaifenesin (ROBITUSSIN) 100 MG/5ML syrup Take 10 mLs by mouth every 6 (six) hours as needed.  . insulin NPH-regular (NOVOLIN 70/30) (70-30) 100 UNIT/ML injection Inject 20 Units into the skin daily with breakfast. And 16 units before dinner  . loperamide (IMODIUM) 2 MG capsule Take 2 capsules (4 mg total) by mouth as needed for diarrhea or loose stools (max 6 tablets daily).  . LORazepam (ATIVAN) 0.5 MG tablet Take 1 tablet (0.5 mg total) by mouth at bedtime.  . magnesium hydroxide (MILK OF MAGNESIA) 400 MG/5ML suspension Take 30 mLs by mouth daily as needed.  . mirtazapine (REMERON) 15 MG tablet Take 15 mg by mouth at bedtime.  Vladimir Faster Glycol-Propyl Glycol (SYSTANE OP) Apply to eye.  . promethazine (PHENERGAN) 12.5 MG tablet Take 12.5 mg by mouth every 8 (eight) hours as needed.  . ranitidine (ZANTAC) 150 MG tablet Take 150 mg by mouth daily.  . traMADol  (ULTRAM) 50 MG tablet Take 1 tablet (50 mg total) by mouth 3 (three) times daily.  . Vitamin D, Ergocalciferol, (DRISDOL) 50000 UNITS CAPS capsule TAKE 1 CAP BY MOUTH EVERY MONTH  . [DISCONTINUED] ciprofloxacin (CIPRO) 250 MG tablet Take 1 tablet (250 mg total) by mouth 2 (two) times daily.  . hydrocortisone (ANUSOL-HC) 2.5 % rectal cream Place 1 application rectally 2 (two) times daily.     Orders Placed This Encounter  Procedures  . Comprehensive metabolic panel  . CBC with Differential  . TSH  . Hemoglobin A1c  . INR/PT  . Ambulatory referral to General Surgery  . HM DIABETES FOOT EXAM    No Follow-up on file.

## 2014-02-12 NOTE — Assessment & Plan Note (Signed)
Recurrent secondary to atrophic vaginitis and dementia.

## 2014-02-12 NOTE — Patient Instructions (Addendum)
She has a large external hemorrhoid that is very irritated abd bothering her  anusol hc ointment twice, daily and refer to Dr [facility] Founds to have an anuscopy and definitive treatment of hemorrhoid  I have reduced her evening dose of insulin by 2 units to avoid low blood sugars in the morning  Make sure she has an annual eye exam

## 2014-02-15 ENCOUNTER — Other Ambulatory Visit: Payer: Self-pay | Admitting: Internal Medicine

## 2014-02-15 ENCOUNTER — Telehealth: Payer: Self-pay

## 2014-02-15 NOTE — Telephone Encounter (Signed)
Relevant patient education mailed to patient.  

## 2014-02-21 ENCOUNTER — Telehealth: Payer: Self-pay | Admitting: Internal Medicine

## 2014-02-21 ENCOUNTER — Encounter: Payer: Self-pay | Admitting: *Deleted

## 2014-02-21 NOTE — Telephone Encounter (Signed)
Minday @ [facility] left message wanting to talk to someone about Theresa Hood blood surgers

## 2014-02-21 NOTE — Telephone Encounter (Signed)
Please decrease Theresa Hood's evening dose of 70/30 insulin to 12 units.   It should go without saying that insulin dose should be  Suspended for cbg of 80 or less , but here it is in writing again.   Letter to that effect drafted and  in chart  Hopefully it printed out.

## 2014-02-21 NOTE — Telephone Encounter (Signed)
Called and notified Mindy to hold insulin for CBG less than 80 until I could send over order.

## 2014-02-21 NOTE — Telephone Encounter (Signed)
Faxed new orders to facility.

## 2014-02-21 NOTE — Telephone Encounter (Signed)
Talked with Mindy at [facility] and patient cbg,s the past two morning have been low Monday CBG 55 gave patient cracker and orange juice, after 30 minutes CBG increased to 150,  Tuesday morning CBG = 64 gave crackers and orange juice CBG increased to 67 only after lunch did patient CBG increase to 83. Patient curreently receiving 20 units of NPH 70/30 in the morning and 16 units of NPH in the evening. Insulin was not held with either CBG reading.

## 2014-02-26 ENCOUNTER — Ambulatory Visit: Payer: Self-pay | Admitting: General Surgery

## 2014-02-28 ENCOUNTER — Encounter: Payer: Self-pay | Admitting: Internal Medicine

## 2014-03-08 ENCOUNTER — Telehealth: Payer: Self-pay | Admitting: Internal Medicine

## 2014-03-08 NOTE — Telephone Encounter (Signed)
Patient was referred on march 9th to [facility] for hemorrhoids,  But has not been notified. Can you find out?

## 2014-03-12 ENCOUNTER — Emergency Department: Payer: Self-pay | Admitting: Emergency Medicine

## 2014-03-12 NOTE — Telephone Encounter (Signed)
Can you call patient or family andtell them to call dr byrnett;s office and reschedule if they still want to see him

## 2014-03-12 NOTE — Telephone Encounter (Signed)
Left message to call office

## 2014-03-12 NOTE — Telephone Encounter (Addendum)
I called and spoke with Theresa Hood at Dr. Dwyane Luo office she states that the patient did have an appointment for 02/26/14 I don't see where we called the patient when this appointment was set up. However Theresa Hood did say their office  has a reminder call for her appointment on 02/26/14 and she was a no show for that appointment.

## 2014-03-14 NOTE — Telephone Encounter (Signed)
Pt son Juanda Crumble) called back wondering the status of referral to Byr[facility]. I informed him that I did not have a DPR ( not even emergency contact listed) to speak with him about her care but he could call Dr. Dwyane Luo office to make an apt for her. Pt did indeed no show for an apt but was not aware of the apt. Looks as if [facility] "authorized" the referral so it dropped off my workque to recall the patient about her apt. However, they do indeed have an apt reminder system. Son aware of the number to Byrnett's office and aware if there are any other issues to give our office a call.     *Information found about referral authorization is in the "view history" of the referral itself.*

## 2014-03-21 NOTE — Telephone Encounter (Signed)
Patient is scheduled to see Byrnett's office 04/02/14.

## 2014-03-23 ENCOUNTER — Telehealth: Payer: Self-pay | Admitting: Internal Medicine

## 2014-03-23 NOTE — Telephone Encounter (Signed)
Letter printed ready to fax.

## 2014-03-23 NOTE — Telephone Encounter (Signed)
Order to the Staff at [facility]   Re: Theresa Hood  Please check patient's BP and pulse today and submit to MD

## 2014-03-28 ENCOUNTER — Encounter: Payer: Self-pay | Admitting: *Deleted

## 2014-03-28 ENCOUNTER — Telehealth: Payer: Self-pay | Admitting: Internal Medicine

## 2014-03-28 DIAGNOSIS — I10 Essential (primary) hypertension: Secondary | ICD-10-CM | POA: Insufficient documentation

## 2014-03-28 DIAGNOSIS — R0989 Other specified symptoms and signs involving the circulatory and respiratory systems: Secondary | ICD-10-CM

## 2014-03-28 MED ORDER — AMLODIPINE BESYLATE 5 MG PO TABS: 5.0000 mg | ORAL_TABLET | Freq: Every day | ORAL | Status: DC

## 2014-03-28 NOTE — Telephone Encounter (Signed)
Patient morning BP X 2 Days has increased. 03/27/14 BP reading 204/94 and BP for 03/28/14  178/84 patient scheduled for hemorrhoid surgery  04/02/14.  Patient C/O general not feeling well. Called in by [facility].

## 2014-03-28 NOTE — Telephone Encounter (Signed)
Facility sent written order and script by fax also called facility and notified Med Tech of order.

## 2014-03-28 NOTE — Telephone Encounter (Signed)
Please draft a letter  To whom it may concern:  Please give Ms  Lingenfelter amlodipine 5 mg daily for BP > 160.  This will require daily BP checks.   Sincerely,    Deborra Medina, MD

## 2014-04-02 ENCOUNTER — Encounter: Payer: Self-pay | Admitting: General Surgery

## 2014-04-02 ENCOUNTER — Ambulatory Visit (INDEPENDENT_AMBULATORY_CARE_PROVIDER_SITE_OTHER): Payer: Medicare Other | Admitting: General Surgery

## 2014-04-02 VITALS — BP 120/80 | HR 80 | Resp 16 | Ht 64.5 in | Wt 137.0 lb

## 2014-04-02 DIAGNOSIS — K649 Unspecified hemorrhoids: Secondary | ICD-10-CM

## 2014-04-02 NOTE — Patient Instructions (Signed)
The patient is aware to call back for any questions or concerns.  

## 2014-04-02 NOTE — Progress Notes (Signed)
Patient ID: Theresa Hood, female   DOB: 1925-04-27, 80 y.o.   MRN: [id number]  Chief Complaint  Patient presents with  . Other    hemorrhoids    HPI Theresa Hood is a 80 y.o. female here today for a evaluation of hemorrhoids. The patient states she has had problems with hemorrhoids for years. She states she has pain that comes and goes. She also has some rectal bleeding at times, although this is fairly infrequent.  She is accompanied today by her son who reports a month or 2 ago she was reporting discomfort on a daily basis, has had no discomfort reported to the family the last 3-to 4 weeks. HPI  Past Medical History  Diagnosis Date  . Diabetes mellitus   . Hypertension   . Hyperlipidemia   . Coronary artery disease   . Melanoma, choroid, left eye     s/p enucleation  . Depression, major, in remission   . Dementia arising in the senium and presenium     Past Surgical History  Procedure Laterality Date  . Joint replacement      total left knee  . Cholecystectomy      Family History  Problem Relation Age of Onset  . Diabetes Mother   . Heart disease Father     Social History History  Substance Use Topics  . Smoking status: Never Smoker   . Smokeless tobacco: Never Used  . Alcohol Use: No    Allergies  Allergen Reactions  . Vicodin [Hydrocodone-Acetaminophen]     Current Outpatient Prescriptions  Medication Sig Dispense Refill  . acetaminophen (TYLENOL) 325 MG tablet Take 650 mg by mouth every 6 (six) hours as needed.      Marland Kitchen amLODipine (NORVASC) 5 MG tablet Take 1 tablet (5 mg total) by mouth daily.  90 tablet  3  . amoxicillin (AMOXIL) 500 MG capsule Take 2,000 mg by mouth. Prior to dental appts.      . benzonatate (TESSALON) 200 MG capsule Take 200 mg by mouth 3 (three) times daily as needed.      . ciprofloxacin (CIPRO) 250 MG tablet Take 1 tablet (250 mg total) by mouth 2 (two) times daily.  6 tablet  0  . clopidogrel (PLAVIX) 75 MG tablet Take 75 mg by mouth  daily.      Marland Kitchen conjugated estrogens (PREMARIN) vaginal cream Insert 1 gram into vagina each night for two weeks,  Then reduce use to twice weekly  42.5 g  12  . donepezil (ARICEPT) 10 MG tablet Take 10 mg by mouth at bedtime.      . fexofenadine (ALLEGRA) 60 MG tablet Take 60 mg by mouth daily as needed.      Marland Kitchen guaifenesin (ROBITUSSIN) 100 MG/5ML syrup Take 10 mLs by mouth every 6 (six) hours as needed.      . hydrocortisone (ANUSOL-HC) 2.5 % rectal cream Place 1 application rectally 2 (two) times daily.  30 g  2  . insulin NPH-regular (NOVOLIN 70/30) (70-30) 100 UNIT/ML injection Inject 20 Units into the skin daily with breakfast. And 16 units before dinner  10 mL  12  . loperamide (IMODIUM) 2 MG capsule Take 2 capsules (4 mg total) by mouth as needed for diarrhea or loose stools (max 6 tablets daily).  30 capsule  5  . LORazepam (ATIVAN) 0.5 MG tablet Take 1 tablet (0.5 mg total) by mouth at bedtime.  30 tablet  4  . magnesium hydroxide (MILK OF MAGNESIA) 400 MG/5ML suspension  Take 30 mLs by mouth daily as needed.      . mirtazapine (REMERON) 15 MG tablet Take 15 mg by mouth at bedtime.      Vladimir Faster Glycol-Propyl Glycol (SYSTANE OP) Apply to eye.      . promethazine (PHENERGAN) 12.5 MG tablet Take 12.5 mg by mouth every 8 (eight) hours as needed.      . ranitidine (ZANTAC) 150 MG tablet Take 150 mg by mouth daily.      . traMADol (ULTRAM) 50 MG tablet Take 1 tablet (50 mg total) by mouth 3 (three) times daily.  30 tablet  5  . TRUEPLUS SAFETY LANCETS 28G MISC TEST BLOOD SUGAR BEFORE MEALS. CALL MD IF BS <60 OR >300.  90 each  5  . Vitamin D, Ergocalciferol, (DRISDOL) 50000 UNITS CAPS capsule TAKE 1 CAP BY MOUTH EVERY MONTH  1 capsule  11   No current facility-administered medications for this visit.    Review of Systems Review of Systems  Constitutional: Negative.   Respiratory: Negative.   Cardiovascular: Negative.   Gastrointestinal: Positive for anal bleeding and rectal pain.     Blood pressure 120/80, pulse 80, resp. rate 16, height 5' 4.5" (1.638 m), weight 137 lb (62.143 kg).  Physical Exam Physical Exam  Constitutional: She appears well-developed and well-nourished.  Cardiovascular: Normal rate, regular rhythm and normal heart sounds.   No murmur heard. Pulmonary/Chest: Effort normal and breath sounds normal.  Abdominal: Normal appearance and bowel sounds are normal. There is no hepatosplenomegaly. There is no tenderness. No hernia.  Genitourinary:     Skin: Skin is warm and dry.    Data Reviewed Endoscopy was completed. The visualized lower rectal mucosa was unremarkable. The prolapsing tissue appears to represent a long-standing internal hemorrhoid. Sphincter tone was at the lower limit of normal. The patient's undergarments showed evidence of a small amount of fecal staining, although she denies incontinence.  Assessment    Intermittent rectal bleeding likely secondary to an anorectal source.     Plan    The bleeding is probably in part aggravated by her ongoing use of Plavix. She doesn't give a history suggestive of ongoing or frequent constipation/diarrhea. She might be a candidate for hemorrhoid banding, but her symptoms seem so mild present that the even the small risk of infection in light of her diabetes might be greater than the anticipated benefits.  At this time we'll take a wait-and-see approach, and hold hemorrhoid banding as a treatment modality if needed.     PCP: Tad Moore Daphane Odekirk 04/02/2014, 9:01 PM

## 2014-04-17 ENCOUNTER — Telehealth: Payer: Self-pay | Admitting: Internal Medicine

## 2014-04-17 NOTE — Telephone Encounter (Addendum)
Spoke with Mindy @ [facility], pt had increased confusion. She will contact [facility] to get UA results to fax to office, did not have them available at the time I spoke with her.

## 2014-04-17 NOTE — Telephone Encounter (Signed)
I  received urine culture from facility on this patient who has dementia.  Please request UA and a history of symptoms,.  i will not treat asymptomatic UTIs in the elderly bc it is not advised.

## 2014-04-25 ENCOUNTER — Telehealth: Payer: Self-pay | Admitting: Internal Medicine

## 2014-04-25 NOTE — Telephone Encounter (Signed)
Talked with Theresa Hood at [facility] to fax over UA

## 2014-04-25 NOTE — Telephone Encounter (Signed)
I still don't have the UA only the Urine culture.  I need a UA to decide if the bactreria represents a colonization or an actual infection

## 2014-04-26 ENCOUNTER — Telehealth: Payer: Self-pay | Admitting: Internal Medicine

## 2014-04-26 DIAGNOSIS — N390 Urinary tract infection, site not specified: Secondary | ICD-10-CM

## 2014-04-26 NOTE — Assessment & Plan Note (Signed)
Facility has been asked to submit UA with culture or no treatment will be given.  Urine culture May 2015 E coli sensitive to cipro,  Awaiting UA

## 2014-05-09 ENCOUNTER — Encounter: Payer: Self-pay | Admitting: Internal Medicine

## 2014-05-09 ENCOUNTER — Telehealth: Payer: Self-pay | Admitting: Internal Medicine

## 2014-05-09 NOTE — Telephone Encounter (Signed)
UA confirms infecxtion , please resend the culture data, in the future they need to send them together

## 2014-05-09 NOTE — Telephone Encounter (Signed)
Nurse Tech in meeting waiting for return call.

## 2014-05-10 NOTE — Telephone Encounter (Signed)
Theresa Hood to  Fax over culture.

## 2014-05-25 ENCOUNTER — Encounter: Payer: Self-pay | Admitting: Internal Medicine

## 2014-06-14 ENCOUNTER — Telehealth: Payer: Self-pay | Admitting: Internal Medicine

## 2014-06-14 NOTE — Telephone Encounter (Signed)
States they have been trying to contact Dr. Derrel Nip x3 weeks for tramadol/pain medication.  States pt only has two pills left.  Asking if director can come over and pick up hard script or if it can be sent asap.  They are concerned that the pt will run out of pain medication and would like a call   [phone] fax

## 2014-06-14 NOTE — Telephone Encounter (Signed)
First contact we have received tramadol filled 3/15 with 5 refills.

## 2014-06-15 ENCOUNTER — Other Ambulatory Visit: Payer: Self-pay | Admitting: Internal Medicine

## 2014-06-15 MED ORDER — HYDROCODONE-ACETAMINOPHEN 5-325 MG PO TABS: 1.0000 | ORAL_TABLET | Freq: Two times a day (BID) | ORAL | Status: DC

## 2014-06-15 NOTE — Telephone Encounter (Addendum)
Patient pain is located in lower back and rectum, Lower back pain is due to positioning believed because patient walks hunched over Rolator walker, rectal pain is from issue with chronic hemorrhoids. Patient has recently had Hemorrhoid surgery.  Script sent.  Hard copy of script placed up front for facility to pick up.

## 2014-06-25 ENCOUNTER — Telehealth: Payer: Self-pay | Admitting: Internal Medicine

## 2014-06-25 MED ORDER — TRAMADOL HCL 50 MG PO TABS: 50.0000 mg | ORAL_TABLET | Freq: Three times a day (TID) | ORAL | Status: DC | PRN

## 2014-06-25 NOTE — Telephone Encounter (Signed)
Script faxed as requested.

## 2014-06-26 ENCOUNTER — Telehealth: Payer: Self-pay | Admitting: *Deleted

## 2014-06-26 NOTE — Telephone Encounter (Signed)
Kimiko from [facility] [employer] [facility] called requesting something Controlled other than Norco (due to pts allergy to Vicodin) for pts pain.  She further states the Tramadol is not effective.  Please advise

## 2014-06-26 NOTE — Telephone Encounter (Signed)
Spoke with Kimiko advised of MDs message.  She states she will have the coordinator call in the morning to schedule an appointment.

## 2014-06-26 NOTE — Telephone Encounter (Signed)
I cannot prescribe anything except tylenol to Theresa Hood without an office visit since the staff is saying that vicoidn and tramadol are no longer options.  She can continue the tramadol for now until I can see her

## 2014-08-08 ENCOUNTER — Emergency Department: Payer: Self-pay | Admitting: Emergency Medicine

## 2014-08-08 LAB — COMPREHENSIVE METABOLIC PANEL
AST: 15 U/L (ref 15–37)
Albumin: 3.6 g/dL (ref 3.4–5.0)
Alkaline Phosphatase: 77 U/L
Anion Gap: 5 — ABNORMAL LOW (ref 7–16)
BILIRUBIN TOTAL: 0.1 mg/dL — AB (ref 0.2–1.0)
BUN: 28 mg/dL — AB (ref 7–18)
Calcium, Total: 9 mg/dL (ref 8.5–10.1)
Chloride: 106 mmol/L (ref 98–107)
Co2: 27 mmol/L (ref 21–32)
Creatinine: 1.18 mg/dL (ref 0.60–1.30)
EGFR (Non-African Amer.): 42 — ABNORMAL LOW
GFR CALC AF AMER: 48 — AB
Glucose: 162 mg/dL — ABNORMAL HIGH (ref 65–99)
Osmolality: 285 (ref 275–301)
POTASSIUM: 3.9 mmol/L (ref 3.5–5.1)
SGPT (ALT): 18 U/L
SODIUM: 138 mmol/L (ref 136–145)
Total Protein: 6.9 g/dL (ref 6.4–8.2)

## 2014-08-08 LAB — URINALYSIS, COMPLETE
Bilirubin,UR: NEGATIVE
KETONE: NEGATIVE
NITRITE: POSITIVE
Ph: 6 (ref 4.5–8.0)
Protein: 30
Specific Gravity: 1.018 (ref 1.003–1.030)

## 2014-08-08 LAB — CBC
HCT: 38.3 % (ref 35.0–47.0)
HGB: 11.6 g/dL — ABNORMAL LOW (ref 12.0–16.0)
MCH: 24.2 pg — AB (ref 26.0–34.0)
MCHC: 30.4 g/dL — ABNORMAL LOW (ref 32.0–36.0)
MCV: 80 fL (ref 80–100)
PLATELETS: 171 10*3/uL (ref 150–440)
RBC: 4.81 10*6/uL (ref 3.80–5.20)
RDW: 16.1 % — ABNORMAL HIGH (ref 11.5–14.5)
WBC: 7.3 10*3/uL (ref 3.6–11.0)

## 2014-08-08 LAB — LIPASE, BLOOD: LIPASE: 147 U/L (ref 73–393)

## 2014-08-08 LAB — TROPONIN I: Troponin-I: <0.02 ng/mL

## 2014-08-21 ENCOUNTER — Encounter: Payer: Self-pay | Admitting: Internal Medicine

## 2014-08-27 ENCOUNTER — Other Ambulatory Visit: Payer: Self-pay | Admitting: Internal Medicine

## 2014-08-29 ENCOUNTER — Encounter: Payer: Self-pay | Admitting: *Deleted

## 2014-08-29 ENCOUNTER — Telehealth: Payer: Self-pay | Admitting: *Deleted

## 2014-08-29 NOTE — Telephone Encounter (Signed)
Spoke with Marvel Plan and Rx faxed

## 2014-08-29 NOTE — Telephone Encounter (Signed)
Kamiko from [facility] [employer] [facility] called reporting pt elevated blood sugars today.  Reports pts blood sugar at 11:30am was 352, checked it 30 minutes later at 12 noon was 295, 45 minutes after eating at 1 pm was 328.  Amado Nash states she reported readings to family and they refused pts transport to the ER.  They wanted the facility to call [facility] first for advice.  Please advise

## 2014-08-29 NOTE — Telephone Encounter (Signed)
They do NOT need to take patient to ER for elevated BS,     Please give an additional 8 units of 70/30  now

## 2014-08-30 ENCOUNTER — Telehealth: Payer: Self-pay | Admitting: *Deleted

## 2014-08-30 NOTE — Telephone Encounter (Signed)
Fine to give insulin as scheduled.

## 2014-08-30 NOTE — Telephone Encounter (Signed)
Maricar from [employer] [employer] [facility] called states pts BSFS is 384 now; states pt ate a slice of sugar free cake at 3:15.  Pt is to have 14 units of 70/30 prior to dinner which will be served at 4:45.  Requesting whether to hold off on 70/30.  Please advise in Dr Lupita Dawn absence.

## 2014-08-30 NOTE — Telephone Encounter (Signed)
Spoke with Maricar, advised her pt is to be given the prescribed 70/30.  Maricar verbalized understanding.

## 2014-09-04 ENCOUNTER — Telehealth: Payer: Self-pay | Admitting: Internal Medicine

## 2014-09-04 MED ORDER — INSULIN NPH ISOPHANE & REGULAR (70-30) 100 UNIT/ML ~~LOC~~ SUSP: 22.0000 [IU] | Freq: Every day | SUBCUTANEOUS | Status: DC

## 2014-09-04 NOTE — Telephone Encounter (Signed)
New letter to facility with insulin dose changes  Drafted and printed this morning   Per blood sugars in blue folder

## 2014-09-04 NOTE — Telephone Encounter (Signed)
Faxed as requested

## 2014-09-04 NOTE — Telephone Encounter (Signed)
Printed letter placed in blue folder.

## 2014-09-30 ENCOUNTER — Emergency Department: Payer: Self-pay | Admitting: Internal Medicine

## 2014-09-30 LAB — URINALYSIS, COMPLETE
BACTERIA: NONE SEEN
Bilirubin,UR: NEGATIVE
Glucose,UR: >=500 mg/dL (ref 0–75)
KETONE: NEGATIVE
Nitrite: NEGATIVE
PH: 6 (ref 4.5–8.0)
PROTEIN: NEGATIVE
RBC,UR: 21 /HPF (ref 0–5)
Specific Gravity: 1.007 (ref 1.003–1.030)
Squamous Epithelial: <1
WBC UR: 170 /HPF (ref 0–5)

## 2014-09-30 LAB — COMPREHENSIVE METABOLIC PANEL
ALK PHOS: 79 U/L
ANION GAP: 8 (ref 7–16)
Albumin: 3.2 g/dL — ABNORMAL LOW (ref 3.4–5.0)
BILIRUBIN TOTAL: 0.2 mg/dL (ref 0.2–1.0)
BUN: 24 mg/dL — ABNORMAL HIGH (ref 7–18)
CALCIUM: 8.4 mg/dL — AB (ref 8.5–10.1)
CHLORIDE: 101 mmol/L (ref 98–107)
Co2: 28 mmol/L (ref 21–32)
Creatinine: 1.65 mg/dL — ABNORMAL HIGH (ref 0.60–1.30)
EGFR (African American): 38 — ABNORMAL LOW
EGFR (Non-African Amer.): 31 — ABNORMAL LOW
Glucose: 346 mg/dL — ABNORMAL HIGH (ref 65–99)
OSMOLALITY: 292 (ref 275–301)
POTASSIUM: 4.2 mmol/L (ref 3.5–5.1)
SGOT(AST): 19 U/L (ref 15–37)
SGPT (ALT): 16 U/L
SODIUM: 137 mmol/L (ref 136–145)
TOTAL PROTEIN: 6.6 g/dL (ref 6.4–8.2)

## 2014-09-30 LAB — CBC
HCT: 36.5 % (ref 35.0–47.0)
HGB: 11.1 g/dL — AB (ref 12.0–16.0)
MCH: 24.7 pg — ABNORMAL LOW (ref 26.0–34.0)
MCHC: 30.3 g/dL — ABNORMAL LOW (ref 32.0–36.0)
MCV: 81 fL (ref 80–100)
PLATELETS: 169 10*3/uL (ref 150–440)
RBC: 4.49 10*6/uL (ref 3.80–5.20)
RDW: 15.4 % — ABNORMAL HIGH (ref 11.5–14.5)
WBC: 7.1 10*3/uL (ref 3.6–11.0)

## 2014-10-02 ENCOUNTER — Telehealth: Payer: Self-pay

## 2014-10-02 ENCOUNTER — Telehealth: Payer: Self-pay | Admitting: Internal Medicine

## 2014-10-02 ENCOUNTER — Encounter: Payer: Self-pay | Admitting: *Deleted

## 2014-10-02 NOTE — Telephone Encounter (Signed)
The nursing home called and stated the patient's blood sugar is above 300.  According to their protocol, the patient is sent out to an emergency room when her blood sugar reaches this point.  However, the son, who is the POA is refusing this service.  They are hoping to obtain an order for insulin to treat the patient's blood sugar appropriately.    [facility] - callback - [phone]

## 2014-10-02 NOTE — Telephone Encounter (Signed)
Signed and faxed

## 2014-10-02 NOTE — Telephone Encounter (Signed)
WE HAVE DONE THIS REPEATEDLY!  I WILL TAKE CARE OF IT TONIGHT,  SEE PRIOR RESPONSE

## 2014-10-02 NOTE — Telephone Encounter (Signed)
YES,  SHE DOES NOT NEED TO GO TO THE ER  TELL THEM TO GIVE AN ADDITIONAL 5 UNITS OF 70/30 WITH HER AFTERNOON DOSE OF 70/30

## 2014-10-02 NOTE — Telephone Encounter (Signed)
Phoned facility gave verbal, need written have placed for signature.

## 2014-10-02 NOTE — Telephone Encounter (Signed)
Please advise 

## 2014-10-02 NOTE — Telephone Encounter (Signed)
Theresa Hood, the pt's son, called saying his mother currently resides at "[facility]" in [place]. Per that residence, they've requested numerous times that Dr. Derrel Nip change the orders of how Theresa Hood is treated when her Blood Sugar is over 300. Currently, when it reaches over 300 she's taken to the hospital every time. Last week she was sent to the hospital three times per her son. Today her levels are 317 and [facility] told her son he'd have to go there personally to sign a waiver or they were going to send her to the hospital again. Theresa Hood is asking if a "sliding scale" can be implemented when it comes to her Blood Sugar levels or something to that effect so she won't have to continue being sent to the hospital. Please call Theresa Hood, pt's son. Charlie's ph# [phone] **Note: [facility] ph# should be in pt's chart...228 something per Charlie. Thank you.

## 2014-10-03 ENCOUNTER — Telehealth: Payer: Self-pay

## 2014-10-03 NOTE — Telephone Encounter (Signed)
The nursing home called at 10:44am and asked to speak with Juliann Pulse or the RN.  Both were with patients.  I relayed this information to the nursing home care taker, however, she asked to be placed on hold until a clinical staff member could answer the call.   A note was placed on Kathy's desk.

## 2014-11-12 ENCOUNTER — Telehealth: Payer: Self-pay | Admitting: Internal Medicine

## 2014-11-12 ENCOUNTER — Other Ambulatory Visit: Payer: Self-pay | Admitting: *Deleted

## 2014-11-12 MED ORDER — TRAMADOL HCL 50 MG PO TABS: 50.0000 mg | ORAL_TABLET | Freq: Three times a day (TID) | ORAL | Status: DC | PRN

## 2014-11-12 NOTE — Telephone Encounter (Signed)
Please clarify the morning insulin dose for Theresa Hood,  The [facility] they sent was not complete and her 11 am sugars are very high.

## 2014-11-12 NOTE — Telephone Encounter (Signed)
Per Misty the [employer] for Patient they are only administering insulin on a sliding scale. Patient is not receiving a morning dose. But only a sliding scale is being administered.

## 2014-11-12 NOTE — Telephone Encounter (Signed)
Faxed order to facility and left message for nurse to return call to office to discuss insulin therapy.

## 2014-11-12 NOTE — Progress Notes (Unsigned)
Facility sent request for hard script on tramadol please look in quick sign folder. RX printed.

## 2014-11-16 ENCOUNTER — Telehealth: Payer: Self-pay | Admitting: *Deleted

## 2014-11-16 NOTE — Telephone Encounter (Signed)
Faxed new order to [facility].

## 2014-11-16 NOTE — Telephone Encounter (Signed)
Fuller Song from [facility], called needing clarification. States they have been administering 70/30 insulin per sliding scale and 7:30 am, 11:30 and 4:30pm. Then they received letter 11/12/14 advising to give 70/30 10 units at breakfast and "continue same evening dose". She needs to know does that replace the standing SS orders that were in place for the morning? Continue SS at lunch and dinner like previously? Fax [phone]

## 2014-12-10 ENCOUNTER — Telehealth: Payer: Self-pay | Admitting: *Deleted

## 2014-12-10 MED ORDER — BENZONATATE 200 MG PO CAPS: 200.0000 mg | ORAL_CAPSULE | Freq: Three times a day (TID) | ORAL | Status: DC | PRN

## 2014-12-10 NOTE — Telephone Encounter (Signed)
Tanae called states pt is still having cough and congestion.  States mucus is clear.  Further states the Robitussin PRN is not effective.  The facility is not able to transport pt to another [facility].  Requesting something else faxed to [phone].  Please advise

## 2014-12-10 NOTE — Telephone Encounter (Signed)
rx printed

## 2014-12-10 NOTE — Telephone Encounter (Signed)
Faxed to facility.

## 2014-12-11 ENCOUNTER — Telehealth: Payer: Self-pay | Admitting: Internal Medicine

## 2014-12-11 MED ORDER — AZITHROMYCIN 250 MG PO TABS: ORAL_TABLET | ORAL | Status: DC

## 2014-12-11 MED ORDER — CULTURELLE DIGESTIVE HEALTH PO CAPS: 1.0000 | ORAL_CAPSULE | Freq: Every day | ORAL | Status: DC

## 2014-12-11 MED ORDER — BENZONATATE 200 MG PO CAPS: 200.0000 mg | ORAL_CAPSULE | Freq: Three times a day (TID) | ORAL | Status: DC | PRN

## 2014-12-11 NOTE — Telephone Encounter (Signed)
Faxed scripts to [facility] as directed.

## 2014-12-11 NOTE — Telephone Encounter (Signed)
Facility called [facility] stating patient has temp 99.6 cough congestion and requesting appointment placed sheet from [facility] in red folder.

## 2014-12-11 NOTE — Telephone Encounter (Signed)
I am not here tomorrow so I will print an rex for antibiotic and a probiotic  To take,  Give her a fllow up with me next week if no better.

## 2014-12-31 ENCOUNTER — Telehealth: Payer: Self-pay | Admitting: Internal Medicine

## 2014-12-31 NOTE — Telephone Encounter (Signed)
Adonis Brook called and stated patient given another patient medication by mistake, patient given Amlodipine 10 mg, Potassium 20 meg, Hydralazine 10 mg and Betha -Echol- Chloride. 25 mg, facility was advised to monitor patient BP every 30 minutes.

## 2015-01-23 ENCOUNTER — Other Ambulatory Visit: Payer: Self-pay | Admitting: Internal Medicine

## 2015-01-23 MED ORDER — CULTURELLE DIGESTIVE HEALTH PO CAPS: 1.0000 | ORAL_CAPSULE | Freq: Every day | ORAL | Status: DC

## 2015-01-23 NOTE — Telephone Encounter (Signed)
Facility faxed for script for patients probiotic. Is patient to continue.?

## 2015-01-23 NOTE — Telephone Encounter (Signed)
Refill printed for 30 days plus 5 refills. Awaiting signature to fax to facility. Sig received script faxed.

## 2015-02-04 ENCOUNTER — Telehealth: Payer: Self-pay | Admitting: Internal Medicine

## 2015-02-04 NOTE — Telephone Encounter (Signed)
Patient needs appt  With me.

## 2015-02-04 NOTE — Telephone Encounter (Signed)
Urgent message came in from facility, Concerning patient continuous headache timesl and  2 days with out relief Tylenol given 500 mg eery 8 hrs and tramadol X2 without response. 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 Prosthesis has fallen out on several times in the last few months. Patient lying on side in chair and not attending functions due to pain. CBG. Fasting today 134, 2 hours post prandial = 224  BP today 126/65/ HR. 62 @ 8.00 Am. Prosthetic eye has not been examined in 2 weeks since last time prosthetic fell out.

## 2015-02-04 NOTE — Telephone Encounter (Signed)
Called facility. Facility is going to call back after contacting POA and advise of appointment.

## 2015-02-11 ENCOUNTER — Telehealth: Payer: Self-pay | Admitting: Internal Medicine

## 2015-02-11 NOTE — Telephone Encounter (Signed)
Patient has not been seen since [place] 2015 and Pt may may not be honored due to that

## 2015-02-11 NOTE — Telephone Encounter (Signed)
Faxed paperwork 

## 2015-03-15 ENCOUNTER — Telehealth: Payer: Self-pay

## 2015-03-15 DIAGNOSIS — E1122 Type 2 diabetes mellitus with diabetic chronic kidney disease: Secondary | ICD-10-CM

## 2015-03-15 DIAGNOSIS — N183 Chronic kidney disease, stage 3 (moderate): Principal | ICD-10-CM

## 2015-03-15 NOTE — Telephone Encounter (Signed)
Order for podiatry referral on printer

## 2015-03-15 NOTE — Telephone Encounter (Signed)
Referral faxed to [phone] as requested

## 2015-03-15 NOTE — Telephone Encounter (Signed)
Spoke with Steffanie Dunn, nurse at [facility], requesting referral to be faxed to her for podiatrist so they may get her an appointment scheduled. States pt is diabetic and toenails need trimmed and also had a broken toenail that looks like possible ingrown nail. Order needs faxed Micheline Maze fax [phone]

## 2015-03-15 NOTE — Addendum Note (Signed)
Addended by: Crecencio Mc on: 03/15/2015 12:21 PM   Modules accepted: Orders

## 2015-03-15 NOTE — Telephone Encounter (Signed)
The patient is hoping to have a referral to a podiatrist.

## 2015-03-22 ENCOUNTER — Ambulatory Visit (INDEPENDENT_AMBULATORY_CARE_PROVIDER_SITE_OTHER): Payer: Medicare Other | Admitting: Internal Medicine

## 2015-03-22 VITALS — BP 138/78 | HR 93 | Temp 97.6°F | Resp 18 | Wt 138.5 lb

## 2015-03-22 DIAGNOSIS — E119 Type 2 diabetes mellitus without complications: Secondary | ICD-10-CM | POA: Diagnosis not present

## 2015-03-22 DIAGNOSIS — Z0000 Encounter for general adult medical examination without abnormal findings: Secondary | ICD-10-CM | POA: Diagnosis not present

## 2015-03-22 DIAGNOSIS — E1122 Type 2 diabetes mellitus with diabetic chronic kidney disease: Secondary | ICD-10-CM

## 2015-03-22 DIAGNOSIS — Z23 Encounter for immunization: Secondary | ICD-10-CM

## 2015-03-22 DIAGNOSIS — R5383 Other fatigue: Secondary | ICD-10-CM

## 2015-03-22 DIAGNOSIS — N183 Chronic kidney disease, stage 3 (moderate): Secondary | ICD-10-CM

## 2015-03-22 DIAGNOSIS — L609 Nail disorder, unspecified: Secondary | ICD-10-CM | POA: Diagnosis not present

## 2015-03-22 DIAGNOSIS — I10 Essential (primary) hypertension: Secondary | ICD-10-CM

## 2015-03-22 DIAGNOSIS — F0390 Unspecified dementia without behavioral disturbance: Secondary | ICD-10-CM

## 2015-03-22 DIAGNOSIS — R350 Frequency of micturition: Secondary | ICD-10-CM

## 2015-03-22 DIAGNOSIS — F068 Other specified mental disorders due to known physiological condition: Secondary | ICD-10-CM

## 2015-03-22 DIAGNOSIS — E876 Hypokalemia: Secondary | ICD-10-CM | POA: Diagnosis not present

## 2015-03-22 DIAGNOSIS — L608 Other nail disorders: Secondary | ICD-10-CM

## 2015-03-22 DIAGNOSIS — E559 Vitamin D deficiency, unspecified: Secondary | ICD-10-CM

## 2015-03-22 LAB — COMPREHENSIVE METABOLIC PANEL
ALK PHOS: 75 U/L (ref 39–117)
ALT: 10 U/L (ref 0–35)
AST: 14 U/L (ref 0–37)
Albumin: 4.2 g/dL (ref 3.5–5.2)
BILIRUBIN TOTAL: 0.2 mg/dL (ref 0.2–1.2)
BUN: 23 mg/dL (ref 6–23)
CO2: 26 mEq/L (ref 19–32)
Calcium: 9.2 mg/dL (ref 8.4–10.5)
Chloride: 103 mEq/L (ref 96–112)
Creat: 1.04 mg/dL (ref 0.50–1.10)
GLUCOSE: 86 mg/dL (ref 70–99)
POTASSIUM: 4 meq/L (ref 3.5–5.3)
Sodium: 137 mEq/L (ref 135–145)
Total Protein: 6.7 g/dL (ref 6.0–8.3)

## 2015-03-22 LAB — TSH: TSH: 0.865 u[IU]/mL (ref 0.350–4.500)

## 2015-03-22 LAB — MAGNESIUM: MAGNESIUM: 2 mg/dL (ref 1.5–2.5)

## 2015-03-22 NOTE — Progress Notes (Signed)
Patient ID: Theresa Hood, female   DOB: 10-17-1925, 81 y.o.   MRN: [id number]  The patient is here for annual Medicare wellness examination and management of other chronic and acute problems. Including Type 2 DM,  hyperllipemia and dementia.  She was last seen a year ago, and today's visit was 45 minutes long.   The risk factors are reflected in the social history.  The roster of all physicians providing medical care to patient - is listed in the Snapshot section of the chart.  Activities of daily living:  The patient is depend [facility] for assistance with most ADLs: dressing, toileting, feeding as well as supervised ambulation with a walkery  Home safety : The patient livs in an A/L facility with smoke detectors.  They wear seatbelts.  There are no firearms at home. There is no violence in the home.   There is no risks for hepatitis, STDs or HIV. There is no   history of blood transfusion. They have no travel history to infectious disease endemic areas of the world.  The patient has seen their dentist in the last six month. They have seen their eye doctor in the last year. They admit to slight hearing difficulty with regard to whispered voices and some television programs.  They have deferred audiologic testing in the last year.  They do not  have excessive sun exposure. Discussed the need for sun protection: hats, long sleeves and use of sunscreen if there is significant sun exposure.   Diet: the importance of a healthy diet is discussed. They do have a healthy diet.  The benefits of regular aerobic exercise were discussed. She does not exercise  Depression screen: there are no signs or vegative symptoms of depression- irritability, change in appetite, anhedonia, sadness/tearfullness.  Cognitive assessment: the patient cannot manage any of  their financial and personal affairs and is actively engaged. They could relate day,date,year and events; recalled 2/3 objects at 3 minutes;  performed clock-face test normally.  The following portions of the patient's history were reviewed and updated as appropriate: allergies, current medications, past family history, past medical history,  past surgical history, past social history  and problem list.  Visual acuity was not assessed per patient preference since she has regular follow up with her ophthalmologist. Hearing and body mass index were assessed and reviewed.   During the course of the visit the patient was educated and counseled about appropriate screening and preventive services including : fall prevention , diabetes screening, nutrition counseling, colorectal cancer screening, and recommended immunizations.    Review of Systems:  Patient denies headache, fevers, malaise, unintentional weight loss, skin rash, eye pain, sinus congestion and sinus pain, sore throat, dysphagia,  hemoptysis , cough, dyspnea, wheezing, chest pain, palpitations, orthopnea, edema, abdominal pain, nausea, melena, diarrhea, constipation, flank pain, dysuria, hematuria, urinary  Frequency, nocturia, numbness, tingling, seizures,  Focal weakness, Loss of consciousness,  Tremor, insomnia, depression, anxiety, and suicidal ideation.    Objective:  BP 138/78 mmHg  Pulse 93  Temp(Src) 97.6 F (36.4 C) (Oral)  Resp 18  Wt 138 lb 8 oz (62.823 kg)  SpO2 98%  General appearance: alert, cooperative and appears stated age Ears: normal TM's and external ear canals both ears Throat: lips, mucosa, and tongue normal; teeth and gums normal Neck: no adenopathy, no carotid bruit, supple, symmetrical, trachea midline and thyroid not enlarged, symmetric, no tenderness/mass/nodules Back: symmetric, no curvature. ROM normal. No CVA tenderness. Lungs: clear to auscultation bilaterally Heart:  regular rate and rhythm, S1, S2 normal, no murmur, click, rub or gallop Abdomen: soft, non-tender; bowel sounds normal; no masses,  no organomegaly Pulses: 2+ and  symmetric Skin: Skin color, texture, turgor normal. No rashes or lesions Lymph nodes: Cervical, supraclavicular, and axillary nodes normal. Foot exam:  Nails are overgrown and curling under   No callouses,  Sensation intact to microfilament  Assessment and Plan:   Problem List Items Addressed This Visit      Unprioritized   Type 2 diabetes mellitus with stage 3 chronic kidney disease    Uncontrolled due to age and environment,  She ives in A/L facility that does not have a carb controlled menu for diabetic patients,.  Will continue 70/30 insulin  And adjust based on submission of blood sugars from facility. Referral to poduatry ordered for management of overgrown nails.   Lab Results  Component Value Date   HGBA1C 8.1* 03/22/2015   Lab Results  Component Value Date   MICROALBUR 5.4* 05/10/2013         Dementia arising in the senium and presenium    She continues to have a markedly intact long-term memory and good preservation of social skills.. Continue Aricept.        Relevant Medications   escitalopram (LEXAPRO) 5 MG tablet   Urinary frequency   Relevant Orders   Urinalysis, dipstick only   Essential hypertension    BP 138/78 mmHg  Pulse 93  Temp(Src) 97.6 F (36.4 C) (Oral)  Resp 18  Wt 138 lb 8 oz (62.823 kg)  SpO2 98%  Well controlled on current regimen. Renal function stable, no changes today.  Lab Results  Component Value Date   CREATININE 1.04 03/22/2015   Lab Results  Component Value Date   NA 137 03/22/2015   K 4.0 03/22/2015   CL 103 03/22/2015   CO2 26 03/22/2015         Medicare annual wellness visit, subsequent    Annual Medicare wellness  exam was done as well as a comprehensive physical exam and management of acute and chronic conditions .  During the course of the visit the patient was educated and counseled about appropriate screening and preventive services including : fall prevention , diabetes screening, nutrition counseling,  colorectal cancer screening, and recommended immunizations.  Printed recommendations for health maintenance screenings was given.        Other Visit Diagnoses    Diabetes mellitus without complication    -  Primary    Relevant Orders    Hemoglobin A1c (Completed)    Microalbumin / creatinine urine ratio    Hypokalemia        Relevant Orders    Comprehensive metabolic panel (Completed)    Magnesium (Completed)    Vitamin D deficiency        Relevant Orders    Vit D  25 hydroxy (rtn osteoporosis monitoring) (Completed)    Other fatigue        Relevant Orders    TSH (Completed)    Toenail deformity        Relevant Orders    Ambulatory referral to Podiatry    Need for vaccination with 13-polyvalent pneumococcal conjugate vaccine        Relevant Orders    Pneumococcal conjugate vaccine 13-valent (Completed)

## 2015-03-22 NOTE — Progress Notes (Signed)
Pre visit review using our clinic review tool, if applicable. No additional management support is needed unless otherwise documented below in the visit note. 

## 2015-03-22 NOTE — Patient Instructions (Addendum)
You are doing well!!  i have made no changes to your medications , but will let you knoe ince i see your labs if there are any changes due  You need to see a podiatrist to have your toenails trimmed.  They are quite long !   Health Maintenance Adopting a healthy lifestyle and getting preventive care can go a long way to promote health and wellness. Talk with your health care provider about what schedule of regular examinations is right for you. This is a good chance for you to check in with your provider about disease prevention and staying healthy. In between checkups, there are plenty of things you can do on your own. Experts have done a lot of research about which lifestyle changes and preventive measures are most likely to keep you healthy. Ask your health care provider for more information. WEIGHT AND DIET  Eat a healthy diet  Be sure to include plenty of vegetables, fruits, low-fat dairy products, and lean protein.  Do not eat a lot of foods high in solid fats, added sugars, or salt.  Get regular exercise. This is one of the most important things you can do for your health.  Most adults should exercise for at least 150 minutes each week. The exercise should increase your heart rate and make you sweat (moderate-intensity exercise).  Most adults should also do strengthening exercises at least twice a week. This is in addition to the moderate-intensity exercise.  Maintain a healthy weight  Body mass index (BMI) is a measurement that can be used to identify possible weight problems. It estimates body fat based on height and weight. Your health care provider can help determine your BMI and help you achieve or maintain a healthy weight.  For females 62 years of age and older:   A BMI below 18.5 is considered underweight.  A BMI of 18.5 to 24.9 is normal.  A BMI of 25 to 29.9 is considered overweight.  A BMI of 30 and above is considered obese.  Watch levels of cholesterol and  blood lipids  You should start having your blood tested for lipids and cholesterol at 79 years of age, then have this test every 5 years.  You may need to have your cholesterol levels checked more often if:  Your lipid or cholesterol levels are high.  You are older than 79 years of age.  You are at high risk for heart disease.  CANCER SCREENING   Lung Cancer  Lung cancer screening is recommended for adults 9-26 years old who are at high risk for lung cancer because of a history of smoking.  A yearly low-dose CT scan of the lungs is recommended for people who:  Currently smoke.  Have quit within the past 15 years.  Have at least a 30-pack-year history of smoking. A pack year is smoking an average of one pack of cigarettes a day for 1 year.  Yearly screening should continue until it has been 15 years since you quit.  Yearly screening should stop if you develop a health problem that would prevent you from having lung cancer treatment.  Breast Cancer  Practice breast self-awareness. This means understanding how your breasts normally appear and feel.  It also means doing regular breast self-exams. Let your health care provider know about any changes, no matter how small.  If you are in your 20s or 30s, you should have a clinical breast exam (CBE) by a health care provider every 1-3 years as  part of a regular health exam.  If you are 40 or older, have a CBE every year. Also consider having a breast X-ray (mammogram) every year.  If you have a family history of breast cancer, talk to your health care provider about genetic screening.  If you are at high risk for breast cancer, talk to your health care provider about having an MRI and a mammogram every year.  Breast cancer gene (BRCA) assessment is recommended for women who have family members with BRCA-related cancers. BRCA-related cancers include:  Breast.  Ovarian.  Tubal.  Peritoneal cancers.  Results of the  assessment will determine the need for genetic counseling and BRCA1 and BRCA2 testing. Cervical Cancer Routine pelvic examinations to screen for cervical cancer are no longer recommended for nonpregnant women who are considered low risk for cancer of the pelvic organs (ovaries, uterus, and vagina) and who do not have symptoms. A pelvic examination may be necessary if you have symptoms including those associated with pelvic infections. Ask your health care provider if a screening pelvic exam is right for you.   The Pap test is the screening test for cervical cancer for women who are considered at risk.  If you had a hysterectomy for a problem that was not cancer or a condition that could lead to cancer, then you no longer need Pap tests.  If you are older than 65 years, and you have had normal Pap tests for the past 10 years, you no longer need to have Pap tests.  If you have had past treatment for cervical cancer or a condition that could lead to cancer, you need Pap tests and screening for cancer for at least 20 years after your treatment.  If you no longer get a Pap test, assess your risk factors if they change (such as having a new sexual partner). This can affect whether you should start being screened again.  Some women have medical problems that increase their chance of getting cervical cancer. If this is the case for you, your health care provider may recommend more frequent screening and Pap tests.  The human papillomavirus (HPV) test is another test that may be used for cervical cancer screening. The HPV test looks for the virus that can cause cell changes in the cervix. The cells collected during the Pap test can be tested for HPV.  The HPV test can be used to screen women 57 years of age and older. Getting tested for HPV can extend the interval between normal Pap tests from three to five years.  An HPV test also should be used to screen women of any age who have unclear Pap test  results.  After 79 years of age, women should have HPV testing as often as Pap tests.  Colorectal Cancer  This type of cancer can be detected and often prevented.  Routine colorectal cancer screening usually begins at 79 years of age and continues through 79 years of age.  Your health care provider may recommend screening at an earlier age if you have risk factors for colon cancer.  Your health care provider may also recommend using home test kits to check for hidden blood in the stool.  A small camera at the end of a tube can be used to examine your colon directly (sigmoidoscopy or colonoscopy). This is done to check for the earliest forms of colorectal cancer.  Routine screening usually begins at age 33.  Direct examination of the colon should be repeated every 5-10  years through 80 years of age. However, you may need to be screened more often if early forms of precancerous polyps or small growths are found. Skin Cancer  Check your skin from head to toe regularly.  Tell your health care provider about any new moles or changes in moles, especially if there is a change in a mole's shape or color.  Also tell your health care provider if you have a mole that is larger than the size of a pencil eraser.  Always use sunscreen. Apply sunscreen liberally and repeatedly throughout the day.  Protect yourself by wearing long sleeves, pants, a wide-brimmed hat, and sunglasses whenever you are outside. HEART DISEASE, DIABETES, AND HIGH BLOOD PRESSURE   Have your blood pressure checked at least every 1-2 years. High blood pressure causes heart disease and increases the risk of stroke.  If you are between 62 years and 9 years old, ask your health care provider if you should take aspirin to prevent strokes.  Have regular diabetes screenings. This involves taking a blood sample to check your fasting blood sugar level.  If you are at a normal weight and have a low risk for diabetes, have this  test once every three years after 79 years of age.  If you are overweight and have a high risk for diabetes, consider being tested at a younger age or more often. PREVENTING INFECTION  Hepatitis B  If you have a higher risk for hepatitis B, you should be screened for this virus. You are considered at high risk for hepatitis B if:  You were born in a country where hepatitis B is common. Ask your health care provider which countries are considered high risk.  Your parents were born in a high-risk country, and you have not been immunized against hepatitis B (hepatitis B vaccine).  You have HIV or AIDS.  You use needles to inject street drugs.  You live with someone who has hepatitis B.  You have had sex with someone who has hepatitis B.  You get hemodialysis treatment.  You take certain medicines for conditions, including cancer, organ transplantation, and autoimmune conditions. Hepatitis C  Blood testing is recommended for:  Everyone born from [birth year] through 1963.  Anyone with known risk factors for hepatitis C. Sexually transmitted infections (STIs)  You should be screened for sexually transmitted infections (STIs) including gonorrhea and chlamydia if:  You are sexually active and are younger than 79 years of age.  You are older than 79 years of age and your health care provider tells you that you are at risk for this type of infection.  Your sexual activity has changed since you were last screened and you are at an increased risk for chlamydia or gonorrhea. Ask your health care provider if you are at risk.  If you do not have HIV, but are at risk, it may be recommended that you take a prescription medicine daily to prevent HIV infection. This is called pre-exposure prophylaxis (PrEP). You are considered at risk if:  You are sexually active and do not regularly use condoms or know the HIV status of your partner(s).  You take drugs by injection.  You are sexually active with  a partner who has HIV. Talk with your health care provider about whether you are at high risk of being infected with HIV. If you choose to begin PrEP, you should first be tested for HIV. You should then be tested every 3 months for as long as you are  taking PrEP.  PREGNANCY   If you are premenopausal and you may become pregnant, ask your health care provider about preconception counseling.  If you may become pregnant, take 400 to 800 micrograms (mcg) of folic acid every day.  If you want to prevent pregnancy, talk to your health care provider about birth control (contraception). OSTEOPOROSIS AND MENOPAUSE   Osteoporosis is a disease in which the bones lose minerals and strength with aging. This can result in serious bone fractures. Your risk for osteoporosis can be identified using a bone density scan.  If you are 55 years of age or older, or if you are at risk for osteoporosis and fractures, ask your health care provider if you should be screened.  Ask your health care provider whether you should take a calcium or vitamin D supplement to lower your risk for osteoporosis.  Menopause may have certain physical symptoms and risks.  Hormone replacement therapy may reduce some of these symptoms and risks. Talk to your health care provider about whether hormone replacement therapy is right for you.  HOME CARE INSTRUCTIONS   Schedule regular health, dental, and eye exams.  Stay current with your immunizations.   Do not use any tobacco products including cigarettes, chewing tobacco, or electronic cigarettes.  If you are pregnant, do not drink alcohol.  If you are breastfeeding, limit how much and how often you drink alcohol.  Limit alcohol intake to no more than 1 drink per day for nonpregnant women. One drink equals 12 ounces of beer, 5 ounces of wine, or 1 ounces of hard liquor.  Do not use street drugs.  Do not share needles.  Ask your health care provider for help if you need  support or information about quitting drugs.  Tell your health care provider if you often feel depressed.  Tell your health care provider if you have ever been abused or do not feel safe at home. Document Released: 06/08/2011 Document Revised: 04/09/2014 Document Reviewed: 10/25/2013 [facility] Patient Information 2015 [place], [place]. This information is not intended to replace advice given to you by your health care provider. Make sure you discuss any questions you have with your health care provider.

## 2015-03-23 LAB — VITAMIN D 25 HYDROXY (VIT D DEFICIENCY, FRACTURES): Vit D, 25-Hydroxy: 21 ng/mL — ABNORMAL LOW (ref 30–100)

## 2015-03-23 LAB — HEMOGLOBIN A1C
Hgb A1c MFr Bld: 8.1 % — ABNORMAL HIGH (ref <5.7)
Mean Plasma Glucose: 186 mg/dL — ABNORMAL HIGH (ref <117)

## 2015-03-24 ENCOUNTER — Encounter: Payer: Self-pay | Admitting: Internal Medicine

## 2015-03-24 DIAGNOSIS — Z0000 Encounter for general adult medical examination without abnormal findings: Secondary | ICD-10-CM | POA: Insufficient documentation

## 2015-03-24 NOTE — Assessment & Plan Note (Signed)
BP 138/78 mmHg  Pulse 93  Temp(Src) 97.6 F (36.4 C) (Oral)  Resp 18  Wt 138 lb 8 oz (62.823 kg)  SpO2 98%  Well controlled on current regimen. Renal function stable, no changes today.  Lab Results  Component Value Date   CREATININE 1.04 03/22/2015   Lab Results  Component Value Date   NA 137 03/22/2015   K 4.0 03/22/2015   CL 103 03/22/2015   CO2 26 03/22/2015

## 2015-03-24 NOTE — Assessment & Plan Note (Addendum)
Uncontrolled due to age and environment,  She ives in A/L facility that does not have a carb controlled menu for diabetic patients,.  Will continue 70/30 insulin  And adjust based on submission of blood sugars from facility. Referral to poduatry ordered for management of overgrown nails.   Lab Results  Component Value Date   HGBA1C 8.1* 03/22/2015   Lab Results  Component Value Date   MICROALBUR 5.4* 05/10/2013

## 2015-03-24 NOTE — Assessment & Plan Note (Signed)

## 2015-03-24 NOTE — Assessment & Plan Note (Signed)
She continues to have a markedly intact long-term memory and good preservation of social skills.. Continue Aricept.

## 2015-03-26 MED ORDER — ERGOCALCIFEROL 1.25 MG (50000 UT) PO CAPS: 50000.0000 [IU] | ORAL_CAPSULE | ORAL | Status: DC

## 2015-03-26 NOTE — Addendum Note (Signed)
Addended by: Crecencio Mc on: 03/26/2015 08:31 PM   Modules accepted: Orders

## 2015-03-27 ENCOUNTER — Ambulatory Visit: Payer: Medicare Other | Admitting: Internal Medicine

## 2015-03-27 ENCOUNTER — Encounter: Payer: Self-pay | Admitting: *Deleted

## 2015-03-29 ENCOUNTER — Other Ambulatory Visit: Payer: Self-pay | Admitting: Internal Medicine

## 2015-04-01 ENCOUNTER — Ambulatory Visit: Payer: Medicare Other | Admitting: Internal Medicine

## 2015-04-09 ENCOUNTER — Other Ambulatory Visit: Payer: Self-pay | Admitting: Internal Medicine

## 2015-04-09 MED ORDER — TRAMADOL HCL 50 MG PO TABS: 50.0000 mg | ORAL_TABLET | Freq: Three times a day (TID) | ORAL | Status: DC | PRN

## 2015-04-17 ENCOUNTER — Other Ambulatory Visit: Payer: Self-pay | Admitting: Internal Medicine

## 2015-04-17 NOTE — Telephone Encounter (Signed)
Pt last OV 4.15.16.  Medication not listed on current med list.  Please advise refill.

## 2015-06-06 ENCOUNTER — Other Ambulatory Visit: Payer: Self-pay

## 2015-06-06 MED ORDER — ESTROGENS, CONJUGATED 0.625 MG/GM VA CREA: TOPICAL_CREAM | VAGINAL | Status: DC

## 2015-06-06 MED ORDER — GLUCOSE BLOOD VI STRP: ORAL_STRIP | Status: DC

## 2015-06-06 MED ORDER — GLUCOSE BLOOD VI STRP
ORAL_STRIP | Status: DC
Start: 2015-06-06 — End: 2015-06-06

## 2015-06-06 NOTE — Telephone Encounter (Signed)
Faxed script to [facility] as requested.

## 2015-06-06 NOTE — Telephone Encounter (Signed)
Homecare called, requesting refills on Premarin vaginal cream and glucose strips for patient.  Last OV was 4.15.16 for annual wellness.  Please advise what test strips to order.  Thanks.  After prescriptions sent please send copy to facility at [phone].

## 2015-06-06 NOTE — Telephone Encounter (Signed)
Ok to refill,  Refill sent  

## 2015-06-07 NOTE — Telephone Encounter (Signed)
refaxed form for estrace in place of premarin.

## 2015-06-18 ENCOUNTER — Telehealth: Payer: Self-pay | Admitting: *Deleted

## 2015-06-18 ENCOUNTER — Other Ambulatory Visit: Payer: Self-pay | Admitting: Internal Medicine

## 2015-06-18 DIAGNOSIS — E1122 Type 2 diabetes mellitus with diabetic chronic kidney disease: Secondary | ICD-10-CM

## 2015-06-18 DIAGNOSIS — N183 Chronic kidney disease, stage 3 unspecified: Secondary | ICD-10-CM

## 2015-06-18 NOTE — Assessment & Plan Note (Signed)
Currently managed with 10 units of 70/30 in the am and 18 units in the PM.  sliding scale novolog for lunchtime use

## 2015-06-18 NOTE — Telephone Encounter (Signed)
Spoke with [employer] another Med Tech.  Advised of MDs message.  Addie states she would fax the blood sugars as advised.

## 2015-06-18 NOTE — Telephone Encounter (Signed)
Brooke from [employer] called states pts Blood Sugar was 301 today.  Please advise

## 2015-06-18 NOTE — Telephone Encounter (Signed)
Spoke with Theresa Hood also a Med [employer].  Theresa Hood states pt has a set dose of 10 units insulin for 7:30 am and 18 units at 4:30 pm and a sliding scale for lunch only.  Theresa Hood further states the parameters read to notify the PCP if blood sugar is under 60 and above 300. Please advise

## 2015-06-18 NOTE — Telephone Encounter (Signed)
Please find out  out if the BS was fasting or post prandial,  And what dose of insulin they gave her THIS IS STandard information that I will always need ,

## 2015-06-18 NOTE — Telephone Encounter (Signed)
They should have a sliding scale to use to determine what dose of 70/30 they are going to give her twice daily,  And it goes up to 300.  If I can recall. If the evening BS is over 300 have them add 5 units to the  Amount instructed on the sliding scale.

## 2015-06-18 NOTE — Telephone Encounter (Signed)
I spoke with Theresa Hood, she states pts blood sugar was taken approx 5 minutes after pt ate soup.  States 30 units of Novolin 70/30 was given.  Next blood sugar reading is scheduled at 4:30pm.  Please advise

## 2015-06-18 NOTE — Telephone Encounter (Signed)
Please have the med tech give patient her usual dose of 70/30 this afternoon and send me a log of her blood sugars for the past week

## 2015-06-19 ENCOUNTER — Telehealth: Payer: Self-pay

## 2015-06-19 NOTE — Telephone Encounter (Signed)
Follow up [facility] Call made. Spoke with Anderson Malta Civil engineer, contracting of Nursing) and she states pt is doing well. BS are stable at 104.

## 2015-06-20 ENCOUNTER — Encounter: Payer: Self-pay | Admitting: *Deleted

## 2015-06-20 ENCOUNTER — Telehealth: Payer: Self-pay | Admitting: Internal Medicine

## 2015-06-20 NOTE — Telephone Encounter (Signed)
Letter printed ready for signature.

## 2015-06-20 NOTE — Telephone Encounter (Signed)
The recent elevated BS of 301 at 11:30 am was the second elevated reading that day,  The monring sugar was 269, and only 10 units was given of 70/30 insulin that morning,  Please draft letter to use as an order  stating that the morning dose of 70/30 insulin should be increased from the usual  10 units to 15 units if Theresa Hood's 7:30 am sugar is > 250

## 2015-06-20 NOTE — Telephone Encounter (Signed)
Letter signed and faxed to facility as directed.

## 2015-06-24 ENCOUNTER — Other Ambulatory Visit: Payer: Self-pay | Admitting: Internal Medicine

## 2015-06-24 MED ORDER — INSULIN NPH ISOPHANE & REGULAR (70-30) 100 UNIT/ML ~~LOC~~ SUSP: SUBCUTANEOUS | Status: DC

## 2015-06-25 ENCOUNTER — Telehealth: Payer: Self-pay | Admitting: Internal Medicine

## 2015-06-25 NOTE — Telephone Encounter (Signed)
Representative from [place] dropped off DNR form to be filled out. Form in Dr. Lupita Dawn [email or web address]

## 2015-06-25 NOTE — Telephone Encounter (Signed)
In red folder. 

## 2015-06-26 NOTE — Telephone Encounter (Signed)
Facility notified and will schedule appointment.

## 2015-06-26 NOTE — Telephone Encounter (Signed)
I cannot sign until I document an end of life discussion with patient and/or healthcare POA. Please schedule an appt

## 2015-07-02 ENCOUNTER — Telehealth: Payer: Self-pay | Admitting: Internal Medicine

## 2015-07-02 MED ORDER — LORAZEPAM 0.5 MG PO TABS: 0.5000 mg | ORAL_TABLET | Freq: Two times a day (BID) | ORAL | Status: DC | PRN

## 2015-07-02 MED ORDER — INSULIN ASPART 100 UNIT/ML ~~LOC~~ SOLN: SUBCUTANEOUS | Status: DC

## 2015-07-02 NOTE — Telephone Encounter (Signed)
All faxed to [facility] as ordered.

## 2015-07-02 NOTE — Telephone Encounter (Signed)
I have written a prescription for Novolog sliding scale from staff to use at lt lunch and it is in red folder with order  I have increased her lorazepam prn dose so they can use up to two times daily for agitation and insomnia,  rx in red folder.

## 2015-07-12 ENCOUNTER — Ambulatory Visit (INDEPENDENT_AMBULATORY_CARE_PROVIDER_SITE_OTHER): Payer: Medicare Other | Admitting: Family Medicine

## 2015-07-12 ENCOUNTER — Encounter: Payer: Self-pay | Admitting: Family Medicine

## 2015-07-12 VITALS — BP 122/70 | HR 71 | Temp 98.2°F | Wt 196.2 lb

## 2015-07-12 DIAGNOSIS — M25562 Pain in left knee: Secondary | ICD-10-CM | POA: Diagnosis not present

## 2015-07-12 MED ORDER — LIDOCAINE HCL (PF) 1 % IJ SOLN: 4.0000 mL | Freq: Once | INTRAMUSCULAR | Status: DC

## 2015-07-12 MED ORDER — METHYLPREDNISOLONE ACETATE 40 MG/ML IJ SUSP: 40.0000 mg | Freq: Once | INTRAMUSCULAR | Status: DC

## 2015-07-12 MED ORDER — TRAMADOL HCL 50 MG PO TABS: 50.0000 mg | ORAL_TABLET | Freq: Three times a day (TID) | ORAL | Status: DC | PRN

## 2015-07-12 MED ORDER — METHYLPREDNISOLONE ACETATE 40 MG/ML IJ SUSP
40.0000 mg | Freq: Once | INTRAMUSCULAR | Status: AC
  Administered 2015-07-12: 40 mg via INTRA_ARTICULAR

## 2015-07-12 NOTE — Assessment & Plan Note (Signed)
No recent fall, trauma, injury. No obvious bony abnormalities. No effusion on exam. Patient is well-appearing with stable vital signs. Knee pain appears to be secondary to underlying OA.  We discussed potential treatment options today including medications, injection.  Patient elected for injection as well as medication. Injection performed without difficulty. Patient to follow up if she fails to improve or worsens.

## 2015-07-12 NOTE — Progress Notes (Signed)
   Subjective:  Patient ID: Theresa Hood, female    DOB: August 04, 1925  Age: 81 y.o. MRN: [id number]  CC: Left knee pain; inability to walk.   HPI 81 year old female with hypertension, dementia, hyponatremia, type 2 diabetes with stage III CKD presents to the clinic today with complaints of Left knee pain and inability to ambulate.  Patient is accompanied by her caregiver today. Patient and caregiver reports that she has been complaining of left knee pain and inability to walk.  Pain is moderate to severe in nature. Pain is described as achy. No known relieving factors. No interventions tried. Pain is exacerbated by walking. Caregiver reports that it may have been swollen recently. No associated fevers or chills. No recent fall, trauma, injury.  Social Hx - Nonsmoker.   Review of Systems  Constitutional: Negative for fever.  Musculoskeletal: Positive for joint swelling.       + Left knee pain.  Neurological: Negative for dizziness and light-headedness.    Objective:  BP 122/70 mmHg  Pulse 71  Temp(Src) 98.2 F (36.8 C) (Oral)  Wt 196 lb 4 oz (89.018 kg)  SpO2 95%  BP/Weight 07/12/2015 03/22/2015 2/35/5732  Systolic BP 202 542 706  Diastolic BP 70 78 80  Wt. (Lbs) 196.25 138.5 137  BMI 33.18 23.41 23.16   Physical Exam  Constitutional:  Well appearing elderly female in NAD.   HENT:  Head: Normocephalic and atraumatic.  Cardiovascular: Normal rate and regular rhythm.   No murmur heard. Pulmonary/Chest: Effort normal and breath sounds normal. She has no wheezes. She has no rales.  Abdominal: Soft. She exhibits no distension. There is no tenderness. There is no rebound and no guarding.  Musculoskeletal:  Left knee:  Normal to inspection with no erythema or effusion.  Palpation normal with no warmth, joint line tenderness, patellar tenderness, or condyle tenderness. Ligaments with solid consistent endpoints including ACL, PCL, LCL, MCL. Marked crepitus with ROM of knee.   Skin:  Skin is warm and dry. No erythema.  Psychiatric: She has a normal mood and affect.   Procedure: Left knee injection Consent signed and scanned into record. Medication:  1 cc Solumedrol, 4 cc Lidocaine 1% without epi Preparation: area cleansed with alcohol Time Out taken  Injection  Landmarks identified Above medication injected using an anterior medial approach. Patient tolerated well without bleeding or paresthesias   Assessment & Plan:   Problem List Items Addressed This Visit    Left knee pain - Primary    No recent fall, trauma, injury. No obvious bony abnormalities. No effusion on exam. Patient is well-appearing with stable vital signs. Knee pain appears to be secondary to underlying OA.  We discussed potential treatment options today including medications, injection.  Patient elected for injection as well as medication. Injection performed without difficulty. Patient to follow up if she fails to improve or worsens.       Relevant Medications   traMADol (ULTRAM) 50 MG tablet   methylPREDNISolone acetate (DEPO-MEDROL) injection 40 mg (Completed)     Follow-up: PRN   Thersa Salt, DO

## 2015-07-12 NOTE — Patient Instructions (Addendum)
It was nice to see you today.  Take the tramadol as prescribed.  Follow up if you worsen or fail to improve.   Take care  Dr.  Lacinda Axon

## 2015-07-19 ENCOUNTER — Telehealth: Payer: Self-pay | Admitting: Internal Medicine

## 2015-07-19 NOTE — Telephone Encounter (Signed)
Referral for home health nursing needs orders signed for PT, OT, Eval an treat plus pain management. Face to face date 07/12/15. Placed with ashley forms.

## 2015-07-24 ENCOUNTER — Telehealth: Payer: Self-pay | Admitting: Internal Medicine

## 2015-07-24 NOTE — Telephone Encounter (Signed)
Theresa Hood [phone] calling from [facility] regarding freq of PT treatment twice a week for 8 weeks. Theresa Hood just needs the okay and everything will be faxed over. Thank You!

## 2015-07-24 NOTE — Telephone Encounter (Signed)
Left detailed message on VM.

## 2015-07-25 ENCOUNTER — Telehealth: Payer: Self-pay | Admitting: Internal Medicine

## 2015-07-25 NOTE — Telephone Encounter (Signed)
Left detailed message on VM with verbal orders 

## 2015-07-25 NOTE — Telephone Encounter (Signed)
Theresa Hood from [facility] [phone] called to get a verbal order occupational therapy for 2 times a week for 4 weeks. Thank You!

## 2015-08-12 ENCOUNTER — Other Ambulatory Visit: Payer: Self-pay | Admitting: Internal Medicine

## 2015-08-22 ENCOUNTER — Telehealth: Payer: Self-pay | Admitting: Internal Medicine

## 2015-08-22 NOTE — Telephone Encounter (Signed)
Spoke with Catherine, she verbalized understanding

## 2015-08-22 NOTE — Telephone Encounter (Signed)
Barnetta Chapel calling from [facility] she received a care form from Dr Derrel Nip. Barnetta Chapel needs more information she does not know what's needed for the pt. Pt is receiving therapy through [facility]. [place] number [phone]. Thank You!

## 2015-08-30 ENCOUNTER — Telehealth: Payer: Self-pay | Admitting: *Deleted

## 2015-08-30 MED ORDER — MIRTAZAPINE 30 MG PO TABS: 30.0000 mg | ORAL_TABLET | Freq: Every day | ORAL | Status: DC

## 2015-08-30 NOTE — Telephone Encounter (Signed)
Patient is already taking mirtazipine.  All other medications are not advised given her age.  I can try incresaing the mmirtazipine  And have written a letter for both

## 2015-08-30 NOTE — Telephone Encounter (Signed)
[facility] has requested a order for Sugar Free mighty Shakes, dur to the patient decrease in meal intake. [facility] also requested a Rx for a medication that would increase patients appetite. -Thanks

## 2015-08-30 NOTE — Telephone Encounter (Signed)
Letter and Rx faxed to [facility] at [phone]

## 2015-08-30 NOTE — Telephone Encounter (Signed)
Please advise 

## 2015-09-06 ENCOUNTER — Telehealth: Payer: Self-pay | Admitting: Internal Medicine

## 2015-09-13 ENCOUNTER — Telehealth: Payer: Self-pay | Admitting: *Deleted

## 2015-09-13 ENCOUNTER — Telehealth: Payer: Self-pay | Admitting: Internal Medicine

## 2015-09-13 DIAGNOSIS — E1122 Type 2 diabetes mellitus with diabetic chronic kidney disease: Secondary | ICD-10-CM

## 2015-09-13 DIAGNOSIS — Z794 Long term (current) use of insulin: Principal | ICD-10-CM

## 2015-09-13 DIAGNOSIS — N183 Chronic kidney disease, stage 3 unspecified: Secondary | ICD-10-CM

## 2015-09-13 NOTE — Telephone Encounter (Signed)
Blood sugars reviewed,  Insulin doses have been adjusted and requested via letter. please fax to facility

## 2015-09-13 NOTE — Telephone Encounter (Signed)
Verbal given 

## 2015-09-13 NOTE — Telephone Encounter (Signed)
PT evaluation was ordered,  Physical Therapy need a verbal ok to do treatments trwice a week-thanks

## 2015-09-13 NOTE — Assessment & Plan Note (Signed)
Dose increasd to 12 units of 70/30 in the am and  20 units in the PM. Based on elevated reradings from September .   sliding scale novolog for lunchtime use

## 2015-09-13 NOTE — Telephone Encounter (Signed)
Faxed to facility as requested.

## 2015-09-23 ENCOUNTER — Telehealth: Payer: Self-pay | Admitting: Internal Medicine

## 2015-10-14 ENCOUNTER — Telehealth: Payer: Self-pay | Admitting: *Deleted

## 2015-10-14 NOTE — Telephone Encounter (Signed)
Spoke with Dr. Derrel Nip, advised Theresa Hood that patient needed 16 units of Insulin.  Faxed orders to [facility].

## 2015-10-14 NOTE — Telephone Encounter (Signed)
Dawn, from [employer] has called to report patients blood sugar of 420, [facility] was advised to call the office when bs was off of sliding scale.

## 2015-10-24 ENCOUNTER — Other Ambulatory Visit: Payer: Self-pay

## 2015-10-24 NOTE — Telephone Encounter (Signed)
Same orders have been faxed over to patient.

## 2015-10-29 ENCOUNTER — Telehealth: Payer: Self-pay | Admitting: *Deleted

## 2015-10-29 NOTE — Telephone Encounter (Signed)
This is the third I have fax over the instruction in regards to patient insulin directions.

## 2015-10-29 NOTE — Telephone Encounter (Signed)
Dawn from [employer] requested clarification of instruction of insulin for patient. Please fax information to [place]

## 2015-11-15 ENCOUNTER — Telehealth: Payer: Self-pay | Admitting: *Deleted

## 2015-11-15 MED ORDER — LORAZEPAM 0.5 MG PO TABS: 0.5000 mg | ORAL_TABLET | Freq: Two times a day (BID) | ORAL | Status: DC | PRN

## 2015-11-15 NOTE — Telephone Encounter (Signed)
Patient in long-term care requesting refill on lorazepam refill printed for MD signature in quick sign

## 2015-12-18 ENCOUNTER — Telehealth: Payer: Self-pay | Admitting: Internal Medicine

## 2015-12-18 ENCOUNTER — Inpatient Hospital Stay
Admission: EM | Admit: 2015-12-18 | Discharge: 2015-12-23 | DRG: 811 | Disposition: A | Discharge status: Skilled Nursing Facility | Payer: Medicare Other | Attending: Internal Medicine | Admitting: Internal Medicine

## 2015-12-18 DIAGNOSIS — N390 Urinary tract infection, site not specified: Secondary | ICD-10-CM | POA: Diagnosis present

## 2015-12-18 DIAGNOSIS — Z85820 Personal history of malignant melanoma of skin: Secondary | ICD-10-CM | POA: Diagnosis not present

## 2015-12-18 DIAGNOSIS — K5900 Constipation, unspecified: Secondary | ICD-10-CM | POA: Diagnosis present

## 2015-12-18 DIAGNOSIS — E1122 Type 2 diabetes mellitus with diabetic chronic kidney disease: Secondary | ICD-10-CM | POA: Diagnosis present

## 2015-12-18 DIAGNOSIS — I2510 Atherosclerotic heart disease of native coronary artery without angina pectoris: Secondary | ICD-10-CM | POA: Diagnosis present

## 2015-12-18 DIAGNOSIS — R197 Diarrhea, unspecified: Secondary | ICD-10-CM

## 2015-12-18 DIAGNOSIS — Z794 Long term (current) use of insulin: Secondary | ICD-10-CM | POA: Diagnosis not present

## 2015-12-18 DIAGNOSIS — E1169 Type 2 diabetes mellitus with other specified complication: Secondary | ICD-10-CM

## 2015-12-18 DIAGNOSIS — E669 Obesity, unspecified: Secondary | ICD-10-CM | POA: Diagnosis present

## 2015-12-18 DIAGNOSIS — B9620 Unspecified Escherichia coli [E. coli] as the cause of diseases classified elsewhere: Secondary | ICD-10-CM | POA: Diagnosis present

## 2015-12-18 DIAGNOSIS — A498 Other bacterial infections of unspecified site: Secondary | ICD-10-CM

## 2015-12-18 DIAGNOSIS — Z9049 Acquired absence of other specified parts of digestive tract: Secondary | ICD-10-CM

## 2015-12-18 DIAGNOSIS — F329 Major depressive disorder, single episode, unspecified: Secondary | ICD-10-CM | POA: Diagnosis present

## 2015-12-18 DIAGNOSIS — G9341 Metabolic encephalopathy: Secondary | ICD-10-CM | POA: Diagnosis present

## 2015-12-18 DIAGNOSIS — Z833 Family history of diabetes mellitus: Secondary | ICD-10-CM | POA: Diagnosis not present

## 2015-12-18 DIAGNOSIS — E785 Hyperlipidemia, unspecified: Secondary | ICD-10-CM | POA: Diagnosis present

## 2015-12-18 DIAGNOSIS — Z96651 Presence of right artificial knee joint: Secondary | ICD-10-CM | POA: Diagnosis present

## 2015-12-18 DIAGNOSIS — F0390 Unspecified dementia without behavioral disturbance: Secondary | ICD-10-CM | POA: Diagnosis present

## 2015-12-18 DIAGNOSIS — I129 Hypertensive chronic kidney disease with stage 1 through stage 4 chronic kidney disease, or unspecified chronic kidney disease: Secondary | ICD-10-CM | POA: Diagnosis present

## 2015-12-18 DIAGNOSIS — E860 Dehydration: Secondary | ICD-10-CM | POA: Diagnosis present

## 2015-12-18 DIAGNOSIS — N3001 Acute cystitis with hematuria: Secondary | ICD-10-CM | POA: Diagnosis present

## 2015-12-18 DIAGNOSIS — M25562 Pain in left knee: Secondary | ICD-10-CM

## 2015-12-18 DIAGNOSIS — Z79899 Other long term (current) drug therapy: Secondary | ICD-10-CM

## 2015-12-18 DIAGNOSIS — A047 Enterocolitis due to Clostridium difficile: Secondary | ICD-10-CM | POA: Diagnosis present

## 2015-12-18 DIAGNOSIS — Z7902 Long term (current) use of antithrombotics/antiplatelets: Secondary | ICD-10-CM

## 2015-12-18 DIAGNOSIS — D649 Anemia, unspecified: Secondary | ICD-10-CM

## 2015-12-18 DIAGNOSIS — F419 Anxiety disorder, unspecified: Secondary | ICD-10-CM | POA: Diagnosis present

## 2015-12-18 DIAGNOSIS — N183 Chronic kidney disease, stage 3 unspecified: Secondary | ICD-10-CM

## 2015-12-18 DIAGNOSIS — Z8249 Family history of ischemic heart disease and other diseases of the circulatory system: Secondary | ICD-10-CM

## 2015-12-18 DIAGNOSIS — Z885 Allergy status to narcotic agent status: Secondary | ICD-10-CM

## 2015-12-18 DIAGNOSIS — D509 Iron deficiency anemia, unspecified: Principal | ICD-10-CM | POA: Diagnosis present

## 2015-12-18 DIAGNOSIS — R531 Weakness: Secondary | ICD-10-CM

## 2015-12-18 LAB — COMPREHENSIVE METABOLIC PANEL
ALK PHOS: 75 U/L (ref 38–126)
ALT: 10 U/L — AB (ref 14–54)
AST: 18 U/L (ref 15–41)
Albumin: 3.7 g/dL (ref 3.5–5.0)
Anion gap: 9 (ref 5–15)
BUN: 23 mg/dL — AB (ref 6–20)
CALCIUM: 9.1 mg/dL (ref 8.9–10.3)
CO2: 21 mmol/L — AB (ref 22–32)
CREATININE: 1 mg/dL (ref 0.44–1.00)
Chloride: 110 mmol/L (ref 101–111)
GFR calc non Af Amer: 49 mL/min — ABNORMAL LOW (ref >60)
GFR, EST AFRICAN AMERICAN: 57 mL/min — AB (ref >60)
GLUCOSE: 191 mg/dL — AB (ref 65–99)
Potassium: 3.9 mmol/L (ref 3.5–5.1)
SODIUM: 140 mmol/L (ref 135–145)
Total Bilirubin: 0.5 mg/dL (ref 0.3–1.2)
Total Protein: 6.7 g/dL (ref 6.5–8.1)

## 2015-12-18 LAB — PREPARE RBC (CROSSMATCH)

## 2015-12-18 LAB — URINALYSIS COMPLETE WITH MICROSCOPIC (ARMC ONLY)
Bilirubin Urine: NEGATIVE
GLUCOSE, UA: NEGATIVE mg/dL
KETONES UR: NEGATIVE mg/dL
NITRITE: POSITIVE — AB
Protein, ur: 30 mg/dL — AB
SPECIFIC GRAVITY, URINE: 1.015 (ref 1.005–1.030)
pH: 5 (ref 5.0–8.0)

## 2015-12-18 LAB — CBC
HCT: 25.6 % — ABNORMAL LOW (ref 35.0–47.0)
HEMOGLOBIN: 7.5 g/dL — AB (ref 12.0–16.0)
MCH: 20 pg — AB (ref 26.0–34.0)
MCHC: 29.3 g/dL — ABNORMAL LOW (ref 32.0–36.0)
MCV: 68.3 fL — ABNORMAL LOW (ref 80.0–100.0)
Platelets: 213 10*3/uL (ref 150–440)
RBC: 3.75 MIL/uL — AB (ref 3.80–5.20)
RDW: 17.8 % — ABNORMAL HIGH (ref 11.5–14.5)
WBC: 5.4 10*3/uL (ref 3.6–11.0)

## 2015-12-18 LAB — ABO/RH: ABO/RH(D): O POS

## 2015-12-18 LAB — GLUCOSE, CAPILLARY: GLUCOSE-CAPILLARY: 162 mg/dL — AB (ref 65–99)

## 2015-12-18 MED ORDER — SODIUM CHLORIDE 0.9 % IV BOLUS (SEPSIS)
1000.0000 mL | Freq: Once | INTRAVENOUS | Status: AC
  Administered 2015-12-18: 1000 mL via INTRAVENOUS

## 2015-12-18 MED ORDER — LORAZEPAM 0.5 MG PO TABS
0.5000 mg | ORAL_TABLET | Freq: Two times a day (BID) | ORAL | Status: DC | PRN
  Administered 2015-12-19: 0.5 mg via ORAL
  Filled 2015-12-18: qty 1

## 2015-12-18 MED ORDER — INSULIN ASPART 100 UNIT/ML ~~LOC~~ SOLN: 0.0000 [IU] | Freq: Every day | SUBCUTANEOUS | Status: DC

## 2015-12-18 MED ORDER — MAGNESIUM HYDROXIDE 400 MG/5ML PO SUSP: 30.0000 mL | Freq: Every day | ORAL | Status: DC | PRN

## 2015-12-18 MED ORDER — MIRTAZAPINE 30 MG PO TABS
30.0000 mg | ORAL_TABLET | Freq: Every day | ORAL | Status: DC
  Administered 2015-12-18 – 2015-12-22 (×5): 30 mg via ORAL
  Filled 2015-12-18 (×6): qty 1

## 2015-12-18 MED ORDER — CEFTRIAXONE SODIUM 1 G IJ SOLR
1.0000 g | INTRAMUSCULAR | Status: DC
  Administered 2015-12-19 – 2015-12-20 (×2): 1 g via INTRAVENOUS
  Filled 2015-12-18 (×5): qty 10

## 2015-12-18 MED ORDER — SODIUM CHLORIDE 0.9 % IV SOLN
Freq: Once | INTRAVENOUS | Status: AC
  Administered 2015-12-18: 20:00:00 via INTRAVENOUS

## 2015-12-18 MED ORDER — DONEPEZIL HCL 5 MG PO TABS
10.0000 mg | ORAL_TABLET | Freq: Every day | ORAL | Status: DC
  Administered 2015-12-18 – 2015-12-22 (×5): 10 mg via ORAL
  Filled 2015-12-18 (×5): qty 2

## 2015-12-18 MED ORDER — TRAMADOL HCL 50 MG PO TABS: 50.0000 mg | ORAL_TABLET | Freq: Four times a day (QID) | ORAL | Status: DC | PRN

## 2015-12-18 MED ORDER — AMLODIPINE BESYLATE 5 MG PO TABS
5.0000 mg | ORAL_TABLET | Freq: Every day | ORAL | Status: DC
  Administered 2015-12-18 – 2015-12-23 (×5): 5 mg via ORAL
  Filled 2015-12-18 (×7): qty 1

## 2015-12-18 MED ORDER — ACETAMINOPHEN 650 MG RE SUPP: 650.0000 mg | Freq: Four times a day (QID) | RECTAL | Status: DC | PRN

## 2015-12-18 MED ORDER — ACETAMINOPHEN 325 MG PO TABS: 650.0000 mg | ORAL_TABLET | Freq: Four times a day (QID) | ORAL | Status: DC | PRN

## 2015-12-18 MED ORDER — INSULIN ASPART 100 UNIT/ML ~~LOC~~ SOLN
0.0000 [IU] | Freq: Three times a day (TID) | SUBCUTANEOUS | Status: DC
  Administered 2015-12-19: 3 [IU] via SUBCUTANEOUS
  Administered 2015-12-19: 12:00:00 1 [IU] via SUBCUTANEOUS
  Administered 2015-12-19 – 2015-12-20 (×2): 2 [IU] via SUBCUTANEOUS
  Administered 2015-12-20: 1 [IU] via SUBCUTANEOUS
  Administered 2015-12-20: 3 [IU] via SUBCUTANEOUS
  Administered 2015-12-21 – 2015-12-23 (×6): 2 [IU] via SUBCUTANEOUS
  Administered 2015-12-23: 13:00:00 3 [IU] via SUBCUTANEOUS
  Filled 2015-12-18: qty 1
  Filled 2015-12-18 (×4): qty 2
  Filled 2015-12-18: qty 3
  Filled 2015-12-18: qty 2
  Filled 2015-12-18: qty 3
  Filled 2015-12-18: qty 1
  Filled 2015-12-18 (×4): qty 2

## 2015-12-18 MED ORDER — BENZONATATE 100 MG PO CAPS: 200.0000 mg | ORAL_CAPSULE | Freq: Three times a day (TID) | ORAL | Status: DC | PRN

## 2015-12-18 MED ORDER — DEXTROSE 5 % IV SOLN
1.0000 g | Freq: Once | INTRAVENOUS | Status: AC
  Administered 2015-12-18: 1 g via INTRAVENOUS
  Filled 2015-12-18: qty 10

## 2015-12-18 MED ORDER — ERGOCALCIFEROL 1.25 MG (50000 UT) PO CAPS
50000.0000 [IU] | ORAL_CAPSULE | ORAL | Status: DC
  Administered 2015-12-18: 18:00:00 50000 [IU] via ORAL
  Filled 2015-12-18 (×2): qty 1

## 2015-12-18 MED ORDER — ONDANSETRON HCL 4 MG PO TABS: 4.0000 mg | ORAL_TABLET | Freq: Four times a day (QID) | ORAL | Status: DC | PRN

## 2015-12-18 MED ORDER — TOPIRAMATE 25 MG PO TABS
25.0000 mg | ORAL_TABLET | Freq: Every day | ORAL | Status: DC
Start: 2015-12-18 — End: 2015-12-23
  Administered 2015-12-18 – 2015-12-23 (×5): 25 mg via ORAL
  Filled 2015-12-18 (×7): qty 1

## 2015-12-18 MED ORDER — ONDANSETRON HCL 4 MG/2ML IJ SOLN: 4.0000 mg | Freq: Four times a day (QID) | INTRAMUSCULAR | Status: DC | PRN

## 2015-12-18 NOTE — ED Provider Notes (Signed)
[facility] Emergency Department Provider Note  Time seen: 9:51 AM  I have reviewed the triage vital signs and the nursing notes.   HISTORY  Chief Complaint Diarrhea and Dehydration    HPI Janeen Penilla is a 82 y.o. female with a past medical history of diabetes, hypertension, hyperlipidemia, dementia who lives at [facility], presents the emergency department with diarrhea. Per EMS report the patient has been experiencing loose stool for the past 36 hours. They state there is been an outbreak of C. difficile in the nursing facility, and the staff wanted her evaluated for dehydration and possible C. difficile. Patient denies nausea, vomiting, abdominal pain. Does admit diarrhea but cannot tell me for how long it has been going on for. Patient's history is likely limited due to her dementia.     Past Medical History  Diagnosis Date  . Diabetes mellitus   . Hypertension   . Hyperlipidemia   . Coronary artery disease   . Melanoma, choroid, left eye     s/p enucleation  . Depression, major, in remission   . Dementia arising in the senium and presenium     Patient Active Problem List   Diagnosis Date Noted  . Left knee pain 07/12/2015  . Medicare annual wellness visit, subsequent 03/24/2015  . Hemorrhoid 04/02/2014  . Essential hypertension 03/28/2014  . Hemorrhoid prolapse 02/12/2014  . UTI (urinary tract infection) 02/09/2014  . Vaginitis, atrophic 09/20/2013  . Urinary frequency 09/25/2012  . Type 2 diabetes mellitus with stage 3 chronic kidney disease ([place])   . Hyperlipidemia   . Coronary artery disease   . Depression, major, in remission ([place])   . Dementia arising in the senium and presenium     Past Surgical History  Procedure Laterality Date  . Joint replacement      total right knee  . Cholecystectomy      Current Outpatient Rx  Name  Route  Sig  Dispense  Refill  . acetaminophen (TYLENOL) 325 MG tablet   Oral   Take  650 mg by mouth every 6 (six) hours as needed.         Marland Kitchen amLODipine (NORVASC) 5 MG tablet   Oral   Take 1 tablet (5 mg total) by mouth daily.   90 tablet   3     For systolic BP > 0000000   . amoxicillin (AMOXIL) 500 MG capsule   Oral   Take 2,000 mg by mouth. Prior to dental appts.         . ciprofloxacin (CIPRO) 250 MG tablet   Oral   Take 1 tablet (250 mg total) by mouth 2 (two) times daily.   6 tablet   0   . clopidogrel (PLAVIX) 75 MG tablet   Oral   Take 75 mg by mouth daily.         Marland Kitchen conjugated estrogens (PREMARIN) vaginal cream      1  Gram inserted into vagina  twice weekly   42.5 g   12   . donepezil (ARICEPT) 10 MG tablet   Oral   Take 10 mg by mouth at bedtime.         . ergocalciferol (DRISDOL) 50000 UNITS capsule   Oral   Take 1 capsule (50,000 Units total) by mouth once a week.   12 capsule   1   . escitalopram (LEXAPRO) 5 MG tablet   Oral   Take 5 mg by mouth daily.         Marland Kitchen  fexofenadine (ALLEGRA) 60 MG tablet   Oral   Take 60 mg by mouth daily as needed.         Marland Kitchen glucose blood test strip      Use as instructed:Summary: TEST BLOOD SUGAR BEFORE MEALS. CALL MD IF BS <60 OR >300.,   100 each   12   . guaifenesin (ROBITUSSIN) 100 MG/5ML syrup   Oral   Take 10 mLs by mouth every 6 (six) hours as needed.         . hydrocortisone (ANUSOL-HC) 2.5 % rectal cream   Rectal   Place 1 application rectally 2 (two) times daily.   30 g   2   . insulin aspart (NOVOLOG) 100 UNIT/ML injection      Give before lunch sliding scale: CBG  151 to 200: Give 3 units; CBG 201 to 250:  Give  6 units; CBG 251 to 300  Give 10 units, CBG 301 to 350   Give 14 units   3 vial   PRN   . insulin NPH-regular Human (NOVOLIN 70/30) (70-30) 100 UNIT/ML injection      10 UNITS BEFORE BREAKFAST,  INCREASE TO 15 FOR bx > 250 And 18 units before dinner  Hold either  dose for BS <  130   10 mL   12   . Lactobacillus-Inulin ([place])  CAPS   Oral   Take 1 capsule by mouth daily.   30 capsule   5     May substitute a generic equivalent   . loperamide (IMODIUM) 2 MG capsule   Oral   Take 2 capsules (4 mg total) by mouth as needed for diarrhea or loose stools (max 6 tablets daily).   30 capsule   5   . LORazepam (ATIVAN) 0.5 MG tablet   Oral   Take 1 tablet (0.5 mg total) by mouth 2 (two) times daily as needed for anxiety or sleep.   60 tablet   4   . magnesium hydroxide (MILK OF MAGNESIA) 400 MG/5ML suspension   Oral   Take 30 mLs by mouth daily as needed.         Marland Kitchen MAPAP 500 MG tablet      TAKE 1 CAPLET BY MOUTH EVERY 8 HOURS   90 tablet   11   . mirtazapine (REMERON) 30 MG tablet   Oral   Take 1 tablet (30 mg total) by mouth at bedtime.   30 tablet   5   . Polyethyl Glycol-Propyl Glycol (SYSTANE OP)   Ophthalmic   Apply to eye.         . promethazine (PHENERGAN) 12.5 MG tablet   Oral   Take 12.5 mg by mouth every 8 (eight) hours as needed.         . ranitidine (ZANTAC) 150 MG tablet   Oral   Take 150 mg by mouth daily.         . traMADol (ULTRAM) 50 MG tablet   Oral   Take 1 tablet (50 mg total) by mouth every 8 (eight) hours as needed.   30 tablet   0   . TRUEPLUS SAFETY LANCETS 28G MISC      TEST BLOOD SUGAR BEFORE MEALS. CALL MD IF BS <60 OR >300.   100 each   2   . White Petrolatum-Mineral Oil (SYSTANE NIGHTTIME) OINT      ADMINISTER TO SURFACE OF PROSTHESIS WITH Q-TIP AS NEEDED BEFORE BEDTIME   3.5 g  9     Allergies Vicodin  Family History  Problem Relation Age of Onset  . Diabetes Mother   . Heart disease Father     Social History Social History  Substance Use Topics  . Smoking status: Never Smoker   . Smokeless tobacco: Never Used  . Alcohol Use: No    Review of Systems Constitutional: Negative for fever. Cardiovascular: Negative for chest pain. Respiratory: Negative for shortness of breath. Gastrointestinal: Negative for abdominal pain.  Positive for diarrhea. Negative for nausea or vomiting. 10-point ROS otherwise negative.  ____________________________________________   PHYSICAL EXAM:  Constitutional: Alert. No distress. Lying in bed comfortably. ENT   Head: Normocephalic and atraumatic.   Mouth/Throat: Mucous membranes are moist. Cardiovascular: Normal rate, regular rhythm.  Respiratory: Normal respiratory effort without tachypnea nor retractions. Breath sounds are clear Gastrointestinal: Soft and nontender. No distention.   Musculoskeletal: Nontender with normal range of motion in all extremities.  Neurologic:  Normal speech and language. No gross focal neurologic deficits Skin:  Skin is warm, dry and intact.  Psychiatric: Mood and affect are normal.   ____________________________________________    INITIAL IMPRESSION / ASSESSMENT AND PLAN / ED COURSE  Pertinent labs & imaging results that were available during my care of the patient were reviewed by me and considered in my medical decision making (see chart for details).  Patient presented to the emergency department approximately 36 hours of diarrhea. Denies nausea, vomiting, abdominal pain. We will check labs, IV hydrate, and check C. difficile testing. We'll place the patient on enteric precautions until results are known.   I discussed the patient with the nursing home providers. They state over the past several days she has had very loose stool with a foul odor, darker in color with mucus. They also noted a color change and the patient seems much more pale which they equated to being related to dehydration. The nurse also states the patient has been much weaker, and not acting her normal self over the past 2-3 days. Given the change in her mental status, pallor with a hemoglobin of 7.5 as well as a urinary tract infection we'll start the patient on Rocephin admit to the hospital and transfuse 1 unit. Currently the patient's rectal exam shows no stool  in the rectal vault, nothing to guaiac.    ____________________________________________   FINAL CLINICAL IMPRESSION(S) / ED DIAGNOSES  Diarrhea Symptomatic anemia Urinary tract infection  Harvest Dark, MD 12/18/15 1253

## 2015-12-18 NOTE — ED Notes (Signed)
Theresa Hood [phone]

## 2015-12-18 NOTE — Telephone Encounter (Signed)
Theresa Hood [phone] called from [facility] regarding about 30 min ago pt had to be sent out for dehydration and Cdiff to [facility] hospital. FYI/ Thank You!

## 2015-12-18 NOTE — ED Notes (Signed)
Care transferred to Amanda, RN

## 2015-12-18 NOTE — Progress Notes (Signed)
FSBS of 108 at 16:09 per NT ayo, results did not transmit to [facility].

## 2015-12-18 NOTE — Telephone Encounter (Signed)
FYI

## 2015-12-18 NOTE — H&P (Addendum)
Theresa Hood at [place] NAME: Theresa Hood    MR#:  [id number]  DATE OF BIRTH:  11/17/1925  DATE OF ADMISSION:  12/18/2015  PRIMARY CARE PHYSICIAN: Crecencio Mc, MD   REQUESTING/REFERRING PHYSICIAN: Harvest Dark  CHIEF COMPLAINT:   Chief Complaint  Patient presents with  . Diarrhea  . Dehydration    HISTORY OF PRESENT ILLNESS:  Theresa Hood  is a 82 y.o. female with a known history of dementia, diabetes, hypertension, history of coronary artery disease, depression, who presented to the hospital due to ongoing diarrhea for the past 36-48 hours. She herself is a very poor historian given her dementia therefore most of the history obtained from the ER physician also from the chart. As per the nursing home patient has been having loose stools now for the past day and a half to 2. At the nursing home they have had a patient was had recent C. difficile colitis. There was a concern that this patient could've also developed C. difficile colitis and therefore was sent to the ER for further evaluation. As per the nursing home patient has also been appearing more pale and dehydrated over the past few days. She was noted to have a hemoglobin of 7.5 and a previous hemoglobin in 2015 was over 11. Given her ongoing diarrhea and symptomatic anemia hospitalist services were contacted for further treatment and evaluation.  PAST MEDICAL HISTORY:   Past Medical History  Diagnosis Date  . Diabetes mellitus   . Hypertension   . Hyperlipidemia   . Coronary artery disease   . Melanoma, choroid, left eye     s/p enucleation  . Depression, major, in remission   . Dementia arising in the senium and presenium     PAST SURGICAL HISTORY:   Past Surgical History  Procedure Laterality Date  . Joint replacement      total right knee  . Cholecystectomy      SOCIAL HISTORY:   Social History  Substance Use Topics  . Smoking status: Never Smoker    . Smokeless tobacco: Never Used  . Alcohol Use: No    FAMILY HISTORY:   Family History  Problem Relation Age of Onset  . Diabetes Mother   . Heart disease Father     DRUG ALLERGIES:   Allergies  Allergen Reactions  . Vicodin [Hydrocodone-Acetaminophen]     REVIEW OF SYSTEMS:   Review of Systems  Unable to perform ROS: dementia    MEDICATIONS AT HOME:   Prior to Admission medications   Medication Sig Start Date End Date Taking? Authorizing Provider  acetaminophen (TYLENOL) 325 MG tablet Take 650 mg by mouth every 6 (six) hours as needed.   Yes Historical Provider, MD  amLODipine (NORVASC) 5 MG tablet Take 1 tablet (5 mg total) by mouth daily. 03/28/14  Yes Crecencio Mc, MD  amoxicillin (AMOXIL) 500 MG capsule Take 2,000 mg by mouth. Prior to dental appts.   Yes Historical Provider, MD  benzonatate (TESSALON) 200 MG capsule Take 200 mg by mouth 3 (three) times daily as needed for cough.   Yes Historical Provider, MD  clopidogrel (PLAVIX) 75 MG tablet Take 75 mg by mouth daily.   Yes Historical Provider, MD  donepezil (ARICEPT) 10 MG tablet Take 10 mg by mouth at bedtime.   Yes Historical Provider, MD  ergocalciferol (DRISDOL) 50000 UNITS capsule Take 1 capsule (50,000 Units total) by mouth once a week. 03/26/15  Yes Teresa L  Derrel Nip, MD  hydrocortisone (ANUSOL-HC) 2.5 % rectal cream Place 1 application rectally 2 (two) times daily. 02/12/14  Yes Crecencio Mc, MD  insulin aspart (NOVOLOG) 100 UNIT/ML injection Give before lunch sliding scale: CBG  151 to 200: Give 3 units; CBG 201 to 250:  Give  6 units; CBG 251 to 300  Give 10 units, CBG 301 to 350   Give 14 units 07/02/15  Yes Crecencio Mc, MD  insulin NPH-regular Human (NOVOLIN 70/30) (70-30) 100 UNIT/ML injection 10 UNITS BEFORE BREAKFAST,  INCREASE TO 15 FOR bx > 250 And 18 units before dinner  Hold either  dose for BS <  130 06/24/15  Yes Crecencio Mc, MD  Lactobacillus-Inulin (Curlew) CAPS Take 1  capsule by mouth daily. 01/23/15  Yes Crecencio Mc, MD  loperamide (IMODIUM) 2 MG capsule Take 2 capsules (4 mg total) by mouth as needed for diarrhea or loose stools (max 6 tablets daily). 05/03/12  Yes Crecencio Mc, MD  LORazepam (ATIVAN) 0.5 MG tablet Take 1 tablet (0.5 mg total) by mouth 2 (two) times daily as needed for anxiety or sleep. 11/15/15  Yes Crecencio Mc, MD  magnesium hydroxide (MILK OF MAGNESIA) 400 MG/5ML suspension Take 30 mLs by mouth daily as needed.   Yes Historical Provider, MD  MAPAP 500 MG tablet TAKE 1 CAPLET BY MOUTH EVERY 8 HOURS 04/17/15  Yes Crecencio Mc, MD  mirtazapine (REMERON) 30 MG tablet Take 1 tablet (30 mg total) by mouth at bedtime. 08/30/15  Yes Crecencio Mc, MD  ranitidine (ZANTAC) 75 MG tablet Take 75 mg by mouth as needed for heartburn.   Yes Historical Provider, MD  topiramate (TOPAMAX) 25 MG tablet Take 25 mg by mouth daily.   Yes Historical Provider, MD  traMADol (ULTRAM) 50 MG tablet Take 1 tablet (50 mg total) by mouth every 8 (eight) hours as needed. 07/12/15  Yes Coral Spikes, DO  ciprofloxacin (CIPRO) 250 MG tablet Take 1 tablet (250 mg total) by mouth 2 (two) times daily. 02/09/14   Crecencio Mc, MD  conjugated estrogens (PREMARIN) vaginal cream 1  Gram inserted into vagina  twice weekly 06/06/15   Crecencio Mc, MD  White Petrolatum-Mineral Oil (SYSTANE NIGHTTIME) OINT ADMINISTER TO SURFACE OF PROSTHESIS WITH Q-TIP AS NEEDED BEFORE BEDTIME 06/18/15   Crecencio Mc, MD      VITAL SIGNS:  Blood pressure 101/46, pulse 65, temperature 98 F (36.7 C), temperature source Oral, resp. rate 18, weight 74.208 kg (163 lb 9.6 oz), SpO2 99 %.  PHYSICAL EXAMINATION:  Physical Exam  GENERAL:  82 y.o.-year-old pale appearing patient lying in the bed with no acute distress.  EYES: Pupils equal, round, reactive to light and accommodation. No scleral icterus. Extraocular muscles intact. Pale conjunctiva.   HEENT: Head atraumatic, normocephalic. Oropharynx  and nasopharynx clear. No oropharyngeal erythema, moist oral mucosa  NECK:  Supple, no jugular venous distention. No thyroid enlargement, no tenderness.  LUNGS: Normal breath sounds bilaterally, no wheezing, rales, rhonchi. No use of accessory muscles of respiration.  CARDIOVASCULAR: S1, S2 RRR. No murmurs, rubs, gallops, clicks.  ABDOMEN: Soft, nontender, nondistended. Bowel sounds present. No organomegaly or mass.  EXTREMITIES: No pedal edema, cyanosis, or clubbing. + 2 pedal & radial pulses b/l.   NEUROLOGIC: Cranial nerves II through XII are intact. No focal Motor or sensory deficits appreciated b/l.  Globally weak PSYCHIATRIC: The patient is alert and oriented x 1.  SKIN: No obvious  rash, lesion, or ulcer.   LABORATORY PANEL:   CBC  Recent Labs Lab 12/18/15 1007  WBC 5.4  HGB 7.5*  HCT 25.6*  PLT 213   ------------------------------------------------------------------------------------------------------------------  Chemistries   Recent Labs Lab 12/18/15 1007  NA 140  K 3.9  CL 110  CO2 21*  GLUCOSE 191*  BUN 23*  CREATININE 1.00  CALCIUM 9.1  AST 18  ALT 10*  ALKPHOS 75  BILITOT 0.5   ------------------------------------------------------------------------------------------------------------------  Cardiac Enzymes No results for input(s): TROPONINI in the last 168 hours. ------------------------------------------------------------------------------------------------------------------  RADIOLOGY:  No results found.   IMPRESSION AND PLAN:   82 year old female with past medical history of dementia, hypertension, history of coronary artery disease, depression, who presented to the hospital due to ongoing diarrhea and noted to also be anemic.  #1 acute diarrhea-etiology unclear. She comes with facility where someone there was recently diagnosed with C. difficile colitis. She was also recently on antibiotics. -I will check stool for competence of culture, C.  difficile. -Place on a clear liquid diet. Follow her clinically. She was started on Imodium at the skilled nursing facility which I will hold until we get the C. difficile PCR back.  #2 Symptomatic anemia-patient's previous hemoglobin in 2015 was over 11 currently at 7.5. This could be related to the acute diarrhea as mentioned above. Patient has no stool in rectal vault therefore we don't know if her Hemoccult was positive. -I will transfuse her 1 unit of packed red blood cells. Hold Plavix. -If her Hemoccult was positive I would consider getting a gastroenterology consult.  #3 dementia-continue Aricept, Remeron.  #4 hypertension-continue Norvasc.  #5 depression-continue Topamax.  #6 anxiety-continue as needed Ativan.  #7 UTI - IV Ceftriaxone and follow urine cultures.    All the records are reviewed and case discussed with ED provider. Management plans discussed with the patient, family and they are in agreement.  CODE STATUS: Full  TOTAL TIME TAKING CARE OF THIS PATIENT: 45 minutes.    Henreitta Leber M.D on 12/18/2015 at 2:43 PM  Between 7am to 6pm - Pager - [phone]  After 6pm go to [email or web address] - password [email or web address] [facility]  Office  [phone]  CC: Primary care physician; Crecencio Mc, MD

## 2015-12-18 NOTE — ED Notes (Signed)
Ptarrvies from home place of [facility] for possible c-diff.  Per EMS reports, facility has had mnay cases of c-diff and they are concerned that she may have it. Pt had diarrhea starting yesterday.

## 2015-12-19 LAB — CBC
HCT: 27.3 % — ABNORMAL LOW (ref 35.0–47.0)
Hemoglobin: 8.3 g/dL — ABNORMAL LOW (ref 12.0–16.0)
MCH: 21.4 pg — ABNORMAL LOW (ref 26.0–34.0)
MCHC: 30.6 g/dL — AB (ref 32.0–36.0)
MCV: 70.1 fL — ABNORMAL LOW (ref 80.0–100.0)
PLATELETS: 193 10*3/uL (ref 150–440)
RBC: 3.89 MIL/uL (ref 3.80–5.20)
RDW: 20.2 % — AB (ref 11.5–14.5)
WBC: 6 10*3/uL (ref 3.6–11.0)

## 2015-12-19 LAB — BASIC METABOLIC PANEL
Anion gap: 4 — ABNORMAL LOW (ref 5–15)
BUN: 19 mg/dL (ref 6–20)
CO2: 21 mmol/L — ABNORMAL LOW (ref 22–32)
CREATININE: 1.03 mg/dL — AB (ref 0.44–1.00)
Calcium: 8.5 mg/dL — ABNORMAL LOW (ref 8.9–10.3)
Chloride: 113 mmol/L — ABNORMAL HIGH (ref 101–111)
GFR calc Af Amer: 55 mL/min — ABNORMAL LOW (ref >60)
GFR, EST NON AFRICAN AMERICAN: 47 mL/min — AB (ref >60)
GLUCOSE: 172 mg/dL — AB (ref 65–99)
Potassium: 3.8 mmol/L (ref 3.5–5.1)
SODIUM: 138 mmol/L (ref 135–145)

## 2015-12-19 LAB — GLUCOSE, CAPILLARY
GLUCOSE-CAPILLARY: 108 mg/dL — AB (ref 65–99)
GLUCOSE-CAPILLARY: 141 mg/dL — AB (ref 65–99)
GLUCOSE-CAPILLARY: 160 mg/dL — AB (ref 65–99)
Glucose-Capillary: 160 mg/dL — ABNORMAL HIGH (ref 65–99)
Glucose-Capillary: 183 mg/dL — ABNORMAL HIGH (ref 65–99)

## 2015-12-19 MED ORDER — POTASSIUM CHLORIDE IN NACL 20-0.9 MEQ/L-% IV SOLN
INTRAVENOUS | Status: DC
  Administered 2015-12-19 – 2015-12-22 (×8): via INTRAVENOUS
  Filled 2015-12-19 (×9): qty 1000

## 2015-12-19 NOTE — Plan of Care (Signed)
Problem: Safety: Goal: Ability to remain free from injury will improve Outcome: Progressing One unit of prbc's infused without any s/sx of reaction. Repeat labs this am. No bm's during the night. Pt continues on enteric precautions.

## 2015-12-19 NOTE — Evaluation (Signed)
Physical Therapy Evaluation Patient Details Name: Theresa Hood MRN: [id number] DOB: 15-Mar-1925 Today's Date: 12/19/2015   History of Present Illness  82 yo female with onset of GI bleed and UTI was admitted, has been transfused, has enteric precautions.   has PMHx:  L eye enucleated, DM, HTN, CAD, dementia.  Clinical Impression  Pt was very weak and unable to sequence movement well, although her PLOF was not clear.  Pt is attempting to work and just will need to have more intensive therapy to make the best transition to independence.  This may be her only path back to ALF, since non ambulatory level may preclude being able to return.    Follow Up Recommendations SNF    Equipment Recommendations  None recommended by PT (await SNF disposition)    Recommendations for Other Services       Precautions / Restrictions Precautions Precautions: Fall Restrictions Weight Bearing Restrictions: No      Mobility  Bed Mobility Overal bed mobility: Needs Assistance Bed Mobility: Supine to Sit;Sit to Supine     Supine to sit: Mod assist;HOB elevated (mainly to assist her for sliding out to EOB) Sit to supine: Mod assist (with level bed and assist to lie down on back and to lift le)      Transfers Overall transfer level: Needs assistance Equipment used: Rolling walker (2 wheeled);1 person hand held assist (total assist to lift partially) Transfers: Sit to/from Stand Sit to Stand: Max assist;Total assist;From elevated surface (partial standing only)         General transfer comment: Pt cannot sequence consistently  Ambulation/Gait             General Gait Details: unable  Stairs            Wheelchair Mobility    Modified Rankin (Stroke Patients Only)       Balance Overall balance assessment: Needs assistance Sitting-balance support: Feet supported Sitting balance-Leahy Scale: Poor                                       Pertinent Vitals/Pain  Pain Assessment: No/denies pain    Home Living Family/patient expects to be discharged to:: Assisted living Living Arrangements: Non-relatives/Friends Available Help at Discharge: Available 24 hours/day Type of Home: Assisted living Home Access: Level entry     Home Layout: One level Home Equipment: Other (comment);Hospital bed (Pt cannot detail her equipment and doesn't know if she walks)      Prior Function Level of Independence: Needs assistance   Gait / Transfers Assistance Needed: no information  ADL's / Homemaking Assistance Needed: ALF resident        Hand Dominance        Extremity/Trunk Assessment   Upper Extremity Assessment: Generalized weakness           Lower Extremity Assessment: Generalized weakness      Cervical / Trunk Assessment: Normal  Communication   Communication: No difficulties  Cognition Arousal/Alertness: Awake/alert Behavior During Therapy: Flat affect Overall Cognitive Status: History of cognitive impairments - at baseline       Memory: Decreased recall of precautions;Decreased short-term memory              General Comments General comments (skin integrity, edema, etc.): Pt was able to get up with help to bedside and can scoot with max assist but is very low energy to attempt to stand.  Her ability is mainly hindered by confusion and recent GI bleed, anemia.  Will need to go to inpt rehab to increase her control of LE's and practice the sequence of arm/leg movements.    Exercises        Assessment/Plan    PT Assessment Patient needs continued PT services  PT Diagnosis Generalized weakness   PT Problem List Decreased strength;Decreased range of motion;Decreased activity tolerance;Decreased balance;Decreased mobility;Decreased coordination;Decreased cognition;Decreased knowledge of use of DME;Decreased safety awareness;Decreased knowledge of precautions;Decreased skin integrity  PT Treatment Interventions DME  instruction;Gait training;Functional mobility training;Therapeutic activities;Therapeutic exercise;Balance training;Neuromuscular re-education;Patient/family education   PT Goals (Current goals can be found in the Care Plan section) Acute Rehab PT Goals Patient Stated Goal: none stated PT Goal Formulation: Patient unable to participate in goal setting Time For Goal Achievement: 01/02/16 Potential to Achieve Goals: Good    Frequency Min 2X/week   Barriers to discharge Decreased caregiver support;Other (comment) (ALF will not be staffed like SNF)      Co-evaluation               End of Session Equipment Utilized During Treatment: Gait belt Activity Tolerance: Patient tolerated treatment well;Patient limited by fatigue;Patient limited by lethargy Patient left: in bed;with call bell/phone within reach;with bed alarm set Nurse Communication: Mobility status         Time: [id number] PT Time Calculation (min) (ACUTE ONLY): 25 min   Charges:   PT Evaluation $PT Eval Moderate Complexity: 1 Procedure PT Treatments $Therapeutic Activity: 8-22 mins   PT G CodesRamond Dial 2016/01/07, 4:49 PM  Mee Hives, PT MS Acute Rehab Dept. Number: [phone] [id number] and [facility] [phone]

## 2015-12-19 NOTE — Care Management (Signed)
Admitted to [facility] with the diagnosis of GI bleed. A resident of [facility] since v6/24/16. Dr. Derrel Nip is primary care physician. Theresa Hood [phone] or [phone]) is [name]'s son.  Loose stools on admission. Received a unit of blood. Hgb 8.3 today. Shelbie Ammons RN MSN CCM Care Management [phone]

## 2015-12-19 NOTE — Clinical Social Work Note (Signed)
Clinical Social Worker consulted for [employer]. PT consult is pending. Full assessment to follow, if appropriate. CSW will continue to follow.   Darden Dates, MSW, LCSW  Clinical Social Worker [phone]

## 2015-12-19 NOTE — Plan of Care (Addendum)
Alert with baseline of confusion, hgb stable, no bm throughout shift, sleeping in between care, incontinent of urine, on room air, tolerating clear liquid diet, denies n/v. Vital signs stable, afebrile. Continues on enteric precautions. Physical therapy consulted for generalized weakness, IV fluids started for dehydration.

## 2015-12-19 NOTE — Progress Notes (Signed)
[place] [facility] [place] NAME: Theresa Hood    MR#:  [id number]  DATE OF BIRTH:  1925/01/28  SUBJECTIVE:  CHIEF COMPLAINT:   Chief Complaint  Patient presents with  . Diarrhea  . Dehydration   The patient is 82 year old female with past medical history significant for history of diabetes, hypertension, hyperlipidemia, coronary artery disease, depression who presents to the hospital with complaints of diarrheal stool. Since patient was exposed to other patient in the nursing home with C. difficile colitis and now developed diarrhea. She was sent to emergency room for further evaluation. In emergency room, she was noted to be anemic with hemoglobin level of 7.5 and admitted to the hospital for further evaluation and treatment. She denies any more diarrheal stool in the the hospital. She was able to eat 100% of offered meals today. Patient was transfused packed blood cells and hemoglobin level improved to 8.3. She feels more comfortable today. Urinalysis revealed pyuria, concerning for urinary tract infection, patient was initiated on Rocephin intravenously. Urine culture revealed 100,000 colony-forming units of gram-negative rods. ID and sensitivities pending.  Review of Systems  Unable to perform ROS: medical condition  Respiratory: Positive for cough.   Gastrointestinal: Positive for diarrhea.    VITAL SIGNS: Blood pressure 117/47, pulse 77, temperature 98.6 F (37 C), temperature source Oral, resp. rate 18, height 5\' 8"  (1.727 m), weight 68.493 kg (151 lb), SpO2 97 %.  PHYSICAL EXAMINATION:   GENERAL:  82 y.o.-year-old patient lying in the bed with no acute distress. Globally weak  EYES: Pupils equal, round, reactive to light and accommodation. No scleral icterus. Extraocular muscles intact.  HEENT: Head atraumatic, normocephalic. Oropharynx and nasopharynx clear.  NECK:  Supple, no jugular venous distention. No thyroid enlargement, no  tenderness.  LUNGS: Normal breath sounds bilaterally, no wheezing, rales,rhonchi or crepitation. No use of accessory muscles of respiration.  CARDIOVASCULAR: S1, S2 normal. No murmurs, rubs, or gallops.  ABDOMEN: Soft, nontender, nondistended. Bowel sounds present. No organomegaly or mass.  EXTREMITIES: No pedal edema, cyanosis, or clubbing.  NEUROLOGIC: Cranial nerves II through XII are intact. Muscle strength 3/5 in all extremities. Sensation grossly intact. Gait not checked.  PSYCHIATRIC: The patient is alert and oriented x 1.  SKIN: No obvious rash, lesion, or ulcer.   ORDERS/RESULTS REVIEWED:   CBC  Recent Labs Lab 12/18/15 1007 12/19/15 0657  WBC 5.4 6.0  HGB 7.5* 8.3*  HCT 25.6* 27.3*  PLT 213 193  MCV 68.3* 70.1*  MCH 20.0* 21.4*  MCHC 29.3* 30.6*  RDW 17.8* 20.2*   ------------------------------------------------------------------------------------------------------------------  Chemistries   Recent Labs Lab 12/18/15 1007 12/19/15 0657  NA 140 138  K 3.9 3.8  CL 110 113*  CO2 21* 21*  GLUCOSE 191* 172*  BUN 23* 19  CREATININE 1.00 1.03*  CALCIUM 9.1 8.5*  AST 18  --   ALT 10*  --   ALKPHOS 75  --   BILITOT 0.5  --    ------------------------------------------------------------------------------------------------------------------ estimated creatinine clearance is 38.1 mL/min (by C-G formula based on Cr of 1.03). ------------------------------------------------------------------------------------------------------------------ No results for input(s): TSH, T4TOTAL, T3FREE, THYROIDAB in the last 72 hours.  Invalid input(s): FREET3  Cardiac Enzymes No results for input(s): CKMB, TROPONINI, MYOGLOBIN in the last 168 hours.  Invalid input(s): CK ------------------------------------------------------------------------------------------------------------------ Invalid input(s):  POCBNP ---------------------------------------------------------------------------------------------------------------  RADIOLOGY: No results found.  EKG:  Orders placed or performed in visit on 12/27/09  . EKG 12-Lead    ASSESSMENT AND PLAN:  Active Problems:   GI bleed 1. Symptomatic anemia, status post 1 unit of packed blood cell transfusion was implemented improved hemoglobin level, getting Hemoccult, follow hemoglobin level in the morning and transfuse as needed 2. Diarrhea, stool cultures are sent, pending 3. Renal insufficiency, initiate patient on IV fluids and follow creatinine in the morning, urinalysis, concerning for urinary tract infection 4. urinary tract infection due to gram-negative rods, urinary culture ID is pending, continue patient on Rocephin 5. Diabetes mellitus type 2, continue sliding scale insulin 6. Generalized weakness, get physical therapist involved for further recommendations   Management plans discussed with the patient, family and they are in agreement.   DRUG ALLERGIES:  Allergies  Allergen Reactions  . Vicodin [Hydrocodone-Acetaminophen]     CODE STATUS:     Code Status Orders        Start     Ordered   12/18/15 1539  Full code   Continuous     12/18/15 1539    Code Status History    Date Active Date Inactive Code Status Order ID Comments User Context   This patient has a current code status but no historical code status.    Advance Directive Documentation        Most Recent Value   Type of Advance Directive  Out of facility DNR (pink MOST or yellow form)   Pre-existing out of facility DNR order (yellow form or pink MOST form)     "MOST" Form in Place?        TOTAL TIME TAKING CARE OF THIS PATIENT: 40 minutes.   Discussed with patient's family Shawan Tosh M.D on 12/19/2015 at 3:08 PM  Between 7am to 6pm - Pager - [phone]  After 6pm go to [email or web address] - password [email or web address] [facility]  Office   [phone]  CC: Primary care physician; Crecencio Mc, MD

## 2015-12-20 LAB — GASTROINTESTINAL PANEL BY PCR, STOOL (REPLACES STOOL CULTURE)
ADENOVIRUS F40/41: NOT DETECTED
Astrovirus: NOT DETECTED
CAMPYLOBACTER SPECIES: NOT DETECTED
CYCLOSPORA CAYETANENSIS: NOT DETECTED
Cryptosporidium: NOT DETECTED
E. coli O157: NOT DETECTED
ENTAMOEBA HISTOLYTICA: NOT DETECTED
ENTEROAGGREGATIVE E COLI (EAEC): NOT DETECTED
ENTEROPATHOGENIC E COLI (EPEC): NOT DETECTED
Enterotoxigenic E coli (ETEC): NOT DETECTED
GIARDIA LAMBLIA: NOT DETECTED
Norovirus GI/GII: NOT DETECTED
PLESIMONAS SHIGELLOIDES: NOT DETECTED
Rotavirus A: NOT DETECTED
SHIGA LIKE TOXIN PRODUCING E COLI (STEC): NOT DETECTED
SHIGELLA/ENTEROINVASIVE E COLI (EIEC): NOT DETECTED
Salmonella species: NOT DETECTED
Sapovirus (I, II, IV, and V): NOT DETECTED
VIBRIO CHOLERAE: NOT DETECTED
Vibrio species: NOT DETECTED
YERSINIA ENTEROCOLITICA: NOT DETECTED

## 2015-12-20 LAB — BASIC METABOLIC PANEL
Anion gap: 5 (ref 5–15)
BUN: 15 mg/dL (ref 6–20)
CALCIUM: 8 mg/dL — AB (ref 8.9–10.3)
CHLORIDE: 111 mmol/L (ref 101–111)
CO2: 19 mmol/L — ABNORMAL LOW (ref 22–32)
CREATININE: 1.03 mg/dL — AB (ref 0.44–1.00)
GFR, EST AFRICAN AMERICAN: 55 mL/min — AB (ref >60)
GFR, EST NON AFRICAN AMERICAN: 47 mL/min — AB (ref >60)
Glucose, Bld: 173 mg/dL — ABNORMAL HIGH (ref 65–99)
Potassium: 3.9 mmol/L (ref 3.5–5.1)
SODIUM: 135 mmol/L (ref 135–145)

## 2015-12-20 LAB — GLUCOSE, CAPILLARY
GLUCOSE-CAPILLARY: 172 mg/dL — AB (ref 65–99)
GLUCOSE-CAPILLARY: 235 mg/dL — AB (ref 65–99)
GLUCOSE-CAPILLARY: 136 mg/dL — AB (ref 65–99)
Glucose-Capillary: 159 mg/dL — ABNORMAL HIGH (ref 65–99)

## 2015-12-20 LAB — TYPE AND SCREEN
ABO/RH(D): O POS
ANTIBODY SCREEN: NEGATIVE
UNIT DIVISION: 0

## 2015-12-20 LAB — C DIFFICILE QUICK SCREEN W PCR REFLEX
C DIFFICILE (CDIFF) TOXIN: NEGATIVE
C DIFFICLE (CDIFF) ANTIGEN: NEGATIVE
C Diff interpretation: NEGATIVE

## 2015-12-20 LAB — OCCULT BLOOD X 1 CARD TO LAB, STOOL: Fecal Occult Bld: NEGATIVE

## 2015-12-20 LAB — HEMOGLOBIN: HEMOGLOBIN: 8.1 g/dL — AB (ref 12.0–16.0)

## 2015-12-20 LAB — URINE CULTURE: Culture: >=100000

## 2015-12-20 MED ORDER — INSULIN DETEMIR 100 UNIT/ML ~~LOC~~ SOLN
7.0000 [IU] | Freq: Two times a day (BID) | SUBCUTANEOUS | Status: DC
  Administered 2015-12-20 – 2015-12-21 (×2): 7 [IU] via SUBCUTANEOUS
  Filled 2015-12-20 (×4): qty 0.07

## 2015-12-20 MED ORDER — CEPHALEXIN 500 MG PO CAPS
500.0000 mg | ORAL_CAPSULE | Freq: Two times a day (BID) | ORAL | Status: DC
  Administered 2015-12-20 – 2015-12-23 (×7): 500 mg via ORAL
  Filled 2015-12-20 (×7): qty 1

## 2015-12-20 NOTE — Clinical Documentation Improvement (Signed)
Internal Medicine Please update your documentation within the medical record to reflect your response to this query. Thank you  Can the diagnosis of "Renal insufficiency" be further specified?   CKD Stage I - GFR greater than or equal to 90  CKD Stage II - GFR 60-89  CKD Stage III - GFR 30-59  CKD Stage IV - GFR 15-29  CKD Stage V - GFR < 15  ESRD (End Stage Renal Disease)  Other condition  Unable to clinically determine  Supporting Information: : (risk factors, signs and symptoms, diagnostics, treatment) 12/19/15 progr note.Marland KitchenMarland Kitchen"Renal insufficiency, initiate patient on IV fluids and follow creatinine in the morning, urinalysis, concerning for urinary tract infection..."  Please exercise your independent, professional judgment when responding. A specific answer is not anticipated or expected.  Thank You, Ermelinda Das, RN, BSN, [place] Certified Clinical Documentation Specialist [place]: Health Information Management [phone]

## 2015-12-20 NOTE — Progress Notes (Signed)
[facility] [place] NAME: Theresa Hood    MR#:  [id number]  DATE OF BIRTH:  30-Jun-1925  SUBJECTIVE:  CHIEF COMPLAINT:   Chief Complaint  Patient presents with  . Diarrhea  . Dehydration   The patient is 82 year old female with past medical history significant for history of diabetes, hypertension, hyperlipidemia, coronary artery disease, depression who presents to the hospital with complaints of diarrheal stool. Since patient was exposed to other patient in the nursing home with C. difficile colitis and developed diarrhea. She was sent to emergency room for further evaluation. In emergency room, she was noted to be anemic with hemoglobin level of 7.5 and admitted to the hospital for further evaluation and treatment. She denies any more diarrheal stool. Patient was transfused packed blood cells and hemoglobin level improved to 8.3. She feels good today. Urinalysis revealed pyuria, concerning for urinary tract infection, patient was initiated on Rocephin intravenously. Urine culture revealed 100,000 Escherichia coli, pansensitive, initiated on Keflex orally. Gastrointestinal consultation is requested. Hemoglobin is stable   Review of Systems  Unable to perform ROS: medical condition  Respiratory: Positive for cough.   Gastrointestinal: Positive for diarrhea.    VITAL SIGNS: Blood pressure 141/54, pulse 72, temperature 98.8 F (37.1 C), temperature source Oral, resp. rate 17, height 5\' 8"  (1.727 m), weight 68.493 kg (151 lb), SpO2 97 %.  PHYSICAL EXAMINATION:   GENERAL:  82 y.o.-year-old patient lying in the bed with no acute distress. Globally weak  EYES: Pupils equal, round, reactive to light and accommodation. No scleral icterus. Extraocular muscles intact.  HEENT: Head atraumatic, normocephalic. Oropharynx and nasopharynx clear.  NECK:  Supple, no jugular venous distention. No thyroid enlargement, no tenderness.  LUNGS: Normal breath  sounds bilaterally, no wheezing, rales,rhonchi or crepitation. No use of accessory muscles of respiration.  CARDIOVASCULAR: S1, S2 normal. No murmurs, rubs, or gallops.  ABDOMEN: Soft, nontender, nondistended. Bowel sounds present. No organomegaly or mass.  EXTREMITIES: No pedal edema, cyanosis, or clubbing.  NEUROLOGIC: Cranial nerves II through XII are intact. Muscle strength 3/5 in all extremities. Sensation grossly intact. Gait not checked.  PSYCHIATRIC: The patient is alert and oriented x 1.  SKIN: No obvious rash, lesion, or ulcer.   ORDERS/RESULTS REVIEWED:   CBC  Recent Labs Lab 12/18/15 1007 12/19/15 0657 12/20/15 0617  WBC 5.4 6.0  --   HGB 7.5* 8.3* 8.1*  HCT 25.6* 27.3*  --   PLT 213 193  --   MCV 68.3* 70.1*  --   MCH 20.0* 21.4*  --   MCHC 29.3* 30.6*  --   RDW 17.8* 20.2*  --    ------------------------------------------------------------------------------------------------------------------  Chemistries   Recent Labs Lab 12/18/15 1007 12/19/15 0657 12/20/15 0617  NA 140 138 135  K 3.9 3.8 3.9  CL 110 113* 111  CO2 21* 21* 19*  GLUCOSE 191* 172* 173*  BUN 23* 19 15  CREATININE 1.00 1.03* 1.03*  CALCIUM 9.1 8.5* 8.0*  AST 18  --   --   ALT 10*  --   --   ALKPHOS 75  --   --   BILITOT 0.5  --   --    ------------------------------------------------------------------------------------------------------------------ estimated creatinine clearance is 38.1 mL/min (by C-G formula based on Cr of 1.03). ------------------------------------------------------------------------------------------------------------------ No results for input(s): TSH, T4TOTAL, T3FREE, THYROIDAB in the last 72 hours.  Invalid input(s): FREET3  Cardiac Enzymes No results for input(s): CKMB, TROPONINI, MYOGLOBIN in the last 168 hours.  Invalid input(s): CK ------------------------------------------------------------------------------------------------------------------ Invalid  input(s): POCBNP ---------------------------------------------------------------------------------------------------------------  RADIOLOGY: No results found.  EKG:  Orders placed or performed in visit on 12/27/09  . EKG 12-Lead    ASSESSMENT AND PLAN:  Active Problems:   GI bleed 1. Symptomatic anemia, status post 1 unit of packed blood cell transfusion ,  improved hemoglobin level, which is stable 2. Diarrhea, stool cultures are sent, pending 3. CK D  stage III , stable on IV fluids  4. urinary tract infection due to Escherichia coli pansensitive , now off Rocephin , on Keflex to be continued for 5 days 5. Diabetes mellitus type 2, continue sliding scale insulin while in the hospital, initiate NPH, resume insulin 7030 upon discharge home, and when oral intake is back to normal 6. Generalized weakness, physical therapist recommended skilled nursing facility placement, where patient will likely be discharged on Monday   Management plans discussed with the patient, family and they are in agreement.   DRUG ALLERGIES:  Allergies  Allergen Reactions  . Vicodin [Hydrocodone-Acetaminophen]     CODE STATUS:     Code Status Orders        Start     Ordered   12/18/15 1539  Full code   Continuous     12/18/15 1539    Code Status History    Date Active Date Inactive Code Status Order ID Comments User Context   This patient has a current code status but no historical code status.    Advance Directive Documentation        Most Recent Value   Type of Advance Directive  Out of facility DNR (pink MOST or yellow form)   Pre-existing out of facility DNR order (yellow form or pink MOST form)     "MOST" Form in Place?        TOTAL TIME TAKING CARE OF THIS PATIENT: 35 minutes.   Discussed with care management Etai Copado M.D on 12/20/2015 at 3:18 PM  Between 7am to 6pm - Pager - [phone]  After 6pm go to [email or web address] - password [email or web address] [facility]   Office  [phone]  CC: Primary care physician; Crecencio Mc, MD

## 2015-12-20 NOTE — Care Management Important Message (Signed)
Important Message  Patient Details  Name: Theresa Hood MRN: [id number] Date of Birth: Dec 25, 1924   Medicare Important Message Given:  Yes    Shelbie Ammons, RN 12/20/2015, 10:52 AM

## 2015-12-20 NOTE — Clinical Social Work Note (Signed)
Clinical Social Work Assessment  Patient Details  Name: Theresa Hood MRN: [id number] Date of Birth: [birth year]  Date of referral:  12/20/15               Reason for consult:  Facility Placement                Permission sought to share information with:    Permission granted to share information::   Youth worker with advanced dementia)  Name::        Agency::     Relationship::     Contact Information:     Housing/Transportation Living arrangements for the past 2 months:  [place] of Information:  Facility Patient Interpreter Needed:  None Criminal Activity/Legal Involvement Pertinent to Current Situation/Hospitalization:  No - Comment as needed Significant Relationships:  Adult Children Lives with:  Facility Resident Do you feel safe going back to the place where you live?    Need for family participation in patient care:     Care giving concerns:  Patient resides at [place] Worker assessment / plan:  CSW discovered patient is a resident at [facility] and that PT assessed patient here and is recommending STR. CSW spoke with Horris Latino at [facility] and she stated patient was not ambulatory prior to this hospitalization and that she was only able to stand and pivot. CSW informed Horris Latino of the recommendation for STR and Horris Latino stated that if the family wishes to pursue this, then that will be fine but she will lose her bed at their facility because she is medicaid. Horris Latino stated that she does want to take patient back. CSW attempted to reach family: son: Aubrei [id number] and had to leave a message. FL2 begun in epic but not sent for signature yet.  Employment status:  Retired Forensic scientist:  [employer] PT Recommendations:  [place] / Referral to community resources:     Patient/Family's Response to care:  Unknown  Patient/Family's Understanding of and Emotional Response to Diagnosis, Current  Treatment, and Prognosis:  Unknown  Emotional Assessment Appearance:    Attitude/Demeanor/Rapport:  Unable to Assess Affect (typically observed):  Unable to Assess Orientation:  Oriented to Self Alcohol / Substance use:  Not Applicable Psych involvement (Current and /or in the community):  No (Comment)  Discharge Needs  Concerns to be addressed:  Care Coordination Readmission within the last 30 days:  No Current discharge risk:  None Barriers to Discharge:  No Barriers Identified   Shela Leff, LCSW 12/20/2015, 10:13 AM

## 2015-12-20 NOTE — Consult Note (Signed)
GI Inpatient Consult Note  Reason for Consult: Anemia, Diarrhea, Possible GI Bleed   Attending Requesting Consult: Dr. Ether Griffins  History of Present Illness: Theresa Hood is a 82 y.o. female with a significant pmh of dementia, recent exposure to c diff at her nursing home, htn and coronary artery disease was admitted for reports of multiple loose stools.  She was found to have a hgb of 7.5, received one transfusion, currently 8.1.  She is not a good historian, the majority of the information was taken from medical records.  She had eaten all the food on her lunch tray.  She denied any pain, chest pain or SOB.  She is on Plavix, probiotics, Zantac  Unsure of recent antibiotic use.  Past Medical History:  Past Medical History  Diagnosis Date  . Diabetes mellitus   . Hypertension   . Hyperlipidemia   . Coronary artery disease   . Melanoma, choroid, left eye ([place])     s/p enucleation  . Depression, major, in remission ([place])   . Dementia arising in the senium and presenium     Problem List: Patient Active Problem List   Diagnosis Date Noted  . GI bleed 12/18/2015  . Left knee pain 07/12/2015  . Medicare annual wellness visit, subsequent 03/24/2015  . Hemorrhoid 04/02/2014  . Essential hypertension 03/28/2014  . Hemorrhoid prolapse 02/12/2014  . UTI (urinary tract infection) 02/09/2014  . Vaginitis, atrophic 09/20/2013  . Urinary frequency 09/25/2012  . Type 2 diabetes mellitus with stage 3 chronic kidney disease ([place])   . Hyperlipidemia   . Coronary artery disease   . Depression, major, in remission ([place])   . Dementia arising in the senium and presenium     Past Surgical History: Past Surgical History  Procedure Laterality Date  . Joint replacement      total right knee  . Cholecystectomy      Allergies: Allergies  Allergen Reactions  . Vicodin [Hydrocodone-Acetaminophen]     Home Medications: Prescriptions prior to admission  Medication Sig Dispense Refill Last  Dose  . acetaminophen (TYLENOL) 325 MG tablet Take 650 mg by mouth every 6 (six) hours as needed.   12/18/2015 at 0800  . amLODipine (NORVASC) 5 MG tablet Take 1 tablet (5 mg total) by mouth daily. 90 tablet 3 12/18/2015 at 0800  . amoxicillin (AMOXIL) 500 MG capsule Take 2,000 mg by mouth. Prior to dental appts.   prn at prn  . benzonatate (TESSALON) 200 MG capsule Take 200 mg by mouth 3 (three) times daily as needed for cough.   prn at prn  . clopidogrel (PLAVIX) 75 MG tablet Take 75 mg by mouth daily.   12/18/2015 at 0800  . donepezil (ARICEPT) 10 MG tablet Take 10 mg by mouth at bedtime.   12/17/2015 at 2000  . ergocalciferol (DRISDOL) 50000 UNITS capsule Take 1 capsule (50,000 Units total) by mouth once a week. 12 capsule 1 Past Week at Unknown time  . hydrocortisone (ANUSOL-HC) 2.5 % rectal cream Place 1 application rectally 2 (two) times daily. 30 g 2 prn at prn  . insulin aspart (NOVOLOG) 100 UNIT/ML injection Give before lunch sliding scale: CBG  151 to 200: Give 3 units; CBG 201 to 250:  Give  6 units; CBG 251 to 300  Give 10 units, CBG 301 to 350   Give 14 units 3 vial PRN 12/17/2015 at Unknown time  . insulin NPH-regular Human (NOVOLIN 70/30) (70-30) 100 UNIT/ML injection 10 UNITS BEFORE BREAKFAST,  INCREASE TO  15 FOR bx > 250 And 18 units before dinner  Hold either  dose for BS <  130 10 mL 12 12/18/2015 at Unknown time  . Lactobacillus-Inulin ([place]) CAPS Take 1 capsule by mouth daily. 30 capsule 5 12/18/2015 at 0800  . loperamide (IMODIUM) 2 MG capsule Take 2 capsules (4 mg total) by mouth as needed for diarrhea or loose stools (max 6 tablets daily). 30 capsule 5 Past Week at Unknown time  . LORazepam (ATIVAN) 0.5 MG tablet Take 1 tablet (0.5 mg total) by mouth 2 (two) times daily as needed for anxiety or sleep. 60 tablet 4 prn at prn  . magnesium hydroxide (MILK OF MAGNESIA) 400 MG/5ML suspension Take 30 mLs by mouth daily as needed.   prn at prn  . MAPAP 500 MG  tablet TAKE 1 CAPLET BY MOUTH EVERY 8 HOURS 90 tablet 11 12/18/2015 at 0800  . mirtazapine (REMERON) 30 MG tablet Take 1 tablet (30 mg total) by mouth at bedtime. 30 tablet 5 12/17/2015 at 2000  . ranitidine (ZANTAC) 75 MG tablet Take 75 mg by mouth as needed for heartburn.   prn at prn  . topiramate (TOPAMAX) 25 MG tablet Take 25 mg by mouth daily.   12/17/2015 at 2000  . traMADol (ULTRAM) 50 MG tablet Take 1 tablet (50 mg total) by mouth every 8 (eight) hours as needed. 30 tablet 0 prn at prn  . ciprofloxacin (CIPRO) 250 MG tablet Take 1 tablet (250 mg total) by mouth 2 (two) times daily. 6 tablet 0 Taking  . conjugated estrogens (PREMARIN) vaginal cream 1  Gram inserted into vagina  twice weekly 42.5 g 12 Taking  . White Petrolatum-Mineral Oil (SYSTANE NIGHTTIME) OINT ADMINISTER TO SURFACE OF PROSTHESIS WITH Q-TIP AS NEEDED BEFORE BEDTIME 3.5 g 9 prn at prn   Home medication reconciliation was completed with the patient.   Scheduled Inpatient Medications:   . amLODipine  5 mg Oral Daily  . cefTRIAXone (ROCEPHIN)  IV  1 g Intravenous Q24H  . donepezil  10 mg Oral QHS  . ergocalciferol  50,000 Units Oral Weekly  . insulin aspart  0-5 Units Subcutaneous QHS  . insulin aspart  0-9 Units Subcutaneous TID WC  . mirtazapine  30 mg Oral QHS  . topiramate  25 mg Oral Daily    Continuous Inpatient Infusions:   . 0.9 % NaCl with KCl 20 mEq / L 100 mL/hr at 12/20/15 0705    PRN Inpatient Medications:  acetaminophen **OR** acetaminophen, benzonatate, LORazepam, magnesium hydroxide, ondansetron **OR** ondansetron (ZOFRAN) IV, traMADol  Family History: family history includes Diabetes in her mother; Heart disease in her father.    Social History:   reports that she has never smoked. She has never used smokeless tobacco. She reports that she does not drink alcohol or use illicit drugs.   Review of Systems: Unable to complete due to dementia   Physical Examination: BP 107/50 mmHg  Pulse 62   Temp(Src) 98.9 F (37.2 C) (Oral)  Resp 17  Ht 5\' 8"  (1.727 m)  Wt 68.493 kg (151 lb)  BMI 22.96 kg/m2  SpO2 97% Gen: NAD, pleasantly demented, poor historian HEENT: PEERLA, EOMI, Neck: supple, no JVD or thyromegaly Chest: CTA bilaterally, no wheezes, crackles, or other adventitious sounds CV: RRR, no m/g/c/r Abd: soft, NT, ND, +BS in all four quadrants; no HSM, guarding, ridigity, or rebound tenderness Ext: no edema, well perfused with 2+ pulses, Skin: no rash or lesions noted Lymph: no LAD  Rectal exam: negative without mass, lesions or tenderness, stool guaiac negative.  Data: Lab Results  Component Value Date   WBC 6.0 12/19/2015   HGB 8.1* 12/20/2015   HCT 27.3* 12/19/2015   MCV 70.1* 12/19/2015   PLT 193 12/19/2015    Recent Labs Lab 12/18/15 1007 12/19/15 0657 12/20/15 0617  HGB 7.5* 8.3* 8.1*   Lab Results  Component Value Date   NA 135 12/20/2015   K 3.9 12/20/2015   CL 111 12/20/2015   CO2 19* 12/20/2015   BUN 15 12/20/2015   CREATININE 1.03* 12/20/2015   Lab Results  Component Value Date   ALT 10* 12/18/2015   AST 18 12/18/2015   ALKPHOS 75 12/18/2015   BILITOT 0.5 12/18/2015   No results for input(s): APTT, INR, PTT in the last 168 hours.    Assessment/Plan: Ms. Desmidt is a 82 y.o. female with anemia, possible GI bleed, hgb on admission 7.5, MCV 68.3, currently 8.1, MCV 70.1.  Hgb in 09/2014 11.1, MCV 81, BUN/Creatinine slightly elevated on admission, back wnl, GFR 47.  Being treated with Rocephin for e coli UTI.  Yellow stool in vault, heme negative. We made a repeat visit and she had had a bowel movement, soft, slightly formed, yellow.  Awaiting studies.  Recommendations: We do not see evidence of GI bleed. We recommend checking iron/ferritin/vit B12/ folate (on admission draw if possible) We recommend completing stool cards on a daily basis while she is here and then monthly stool cards.    We will continue to follow with you. Thank you for  the consult. Please call with questions or concerns.  Salvadore Farber, PA-C  I personally performed these services.

## 2015-12-20 NOTE — Progress Notes (Signed)
Patients son - Juanda Crumble - updated via telephone regarding patient status. Madlyn Frankel, RN

## 2015-12-20 NOTE — Consult Note (Signed)
Patient with diarrhea which appears to be settling down.  A small stool mushy in consistency was collected in specimen jar and no blood or melena seen.  She is Anemic and hypochromic microcytic so iron def is very possible,  No specific recommendations at this time.  Since she got a unit of blood her iron studies may or may not be accurate.  Would start her on iron by mouth if she will take it.

## 2015-12-21 LAB — GLUCOSE, CAPILLARY
GLUCOSE-CAPILLARY: 97 mg/dL (ref 65–99)
GLUCOSE-CAPILLARY: 155 mg/dL — AB (ref 65–99)
Glucose-Capillary: 159 mg/dL — ABNORMAL HIGH (ref 65–99)
Glucose-Capillary: 177 mg/dL — ABNORMAL HIGH (ref 65–99)

## 2015-12-21 LAB — HEMOGLOBIN: HEMOGLOBIN: 9.1 g/dL — AB (ref 12.0–16.0)

## 2015-12-21 MED ORDER — FERROUS SULFATE 220 (44 FE) MG/5ML PO ELIX
220.0000 mg | ORAL_SOLUTION | Freq: Three times a day (TID) | ORAL | Status: DC
Start: 2015-12-21 — End: 2015-12-23
  Administered 2015-12-21 – 2015-12-23 (×6): 220 mg via ORAL
  Filled 2015-12-21 (×8): qty 5

## 2015-12-21 MED ORDER — INSULIN ASPART PROT & ASPART (70-30 MIX) 100 UNIT/ML ~~LOC~~ SUSP
10.0000 [IU] | Freq: Two times a day (BID) | SUBCUTANEOUS | Status: DC
Start: 2015-12-21 — End: 2015-12-21

## 2015-12-21 MED ORDER — INSULIN ASPART PROT & ASPART (70-30 MIX) 100 UNIT/ML ~~LOC~~ SUSP
10.0000 [IU] | Freq: Two times a day (BID) | SUBCUTANEOUS | Status: DC
  Administered 2015-12-22 – 2015-12-23 (×3): 10 [IU] via SUBCUTANEOUS
  Filled 2015-12-21 (×4): qty 10

## 2015-12-21 NOTE — Consult Note (Signed)
GI Inpatient Follow-up Note  Patient Identification: Theresa Hood is a 82 y.o. female who is pleasantly demented, with pmh of htn and cad  Subjective: Her hgb has improved to 9.1.  Her stool was heme negative.    She is a poor historian.  She denies any pain.  Scheduled Inpatient Medications:  . amLODipine  5 mg Oral Daily  . cephALEXin  500 mg Oral Q12H  . donepezil  10 mg Oral QHS  . ergocalciferol  50,000 Units Oral Weekly  . insulin aspart  0-5 Units Subcutaneous QHS  . insulin aspart  0-9 Units Subcutaneous TID WC  . insulin detemir  7 Units Subcutaneous BID AC & HS  . mirtazapine  30 mg Oral QHS  . topiramate  25 mg Oral Daily    Continuous Inpatient Infusions:   . 0.9 % NaCl with KCl 20 mEq / L 100 mL/hr at 12/21/15 1434    PRN Inpatient Medications:  acetaminophen **OR** acetaminophen, benzonatate, LORazepam, magnesium hydroxide, ondansetron **OR** ondansetron (ZOFRAN) IV, traMADol  Review of Systems: Unable to assess due to dementia   Physical Examination: BP 124/57 mmHg  Pulse 68  Temp(Src) 98.4 F (36.9 C) (Oral)  Resp 20  Ht 5\' 8"  (1.727 m)  Wt 68.493 kg (151 lb)  BMI 22.96 kg/m2  SpO2 100% Gen: NAD, pleasantly demented, poor historian HEENT: PEERLA, EOMI, Neck: supple, no JVD or thyromegaly Chest: CTA bilaterally, no wheezes, crackles, or other adventitious sounds CV: RRR, no m/g/c/r Abd: soft, NT, ND, +BS in all four quadrants; no HSM, guarding, ridigity, or rebound tenderness Ext: no edema, well perfused with 2+ pulses, Skin: no rash or lesions noted Lymph: no LAD  Data: Lab Results  Component Value Date   WBC 6.0 12/19/2015   HGB 9.1* 12/21/2015   HCT 27.3* 12/19/2015   MCV 70.1* 12/19/2015   PLT 193 12/19/2015    Recent Labs Lab 12/19/15 0657 12/20/15 0617 12/21/15 0602  HGB 8.3* 8.1* 9.1*   Lab Results  Component Value Date   NA 135 12/20/2015   K 3.9 12/20/2015   CL 111 12/20/2015   CO2 19* 12/20/2015   BUN 15 12/20/2015    CREATININE 1.03* 12/20/2015   Lab Results  Component Value Date   ALT 10* 12/18/2015   AST 18 12/18/2015   ALKPHOS 75 12/18/2015   BILITOT 0.5 12/18/2015   No results for input(s): APTT, INR, PTT in the last 168 hours. Assessment/Plan: Theresa Hood is a 82 y.o. female with dementia, htn, cad, anemia.  hgb improved from 8.1 to 9.1, neg c diff.   Recommendations: We still recommend checking iron/ferritin/ B12 and folate.  We also recommend monthly stool cards be completed on discharge.  We will continue to follow with you.  Please call with questions or concerns.  Theresa Farber, PA-C  I personally performed these services.

## 2015-12-21 NOTE — Plan of Care (Signed)
Problem: Education: Goal: Knowledge of [facility] Education information/materials will improve Outcome: Progressing Pt is alert to self, at times alert to location. Pleasant. High Fall Risk, encouraged to call for assistance. Remains incontinent of urine. Peri care performed. Receiving PO antibiotics. No c/o pain at this time.

## 2015-12-21 NOTE — Plan of Care (Signed)
Pt from SNF admitted w/UTI and possible cdiff/gi bleed.  Neg for Cdiff.  No blood in stool.  Diarrhea slowing down.  On PO abx for UTI.  Hgb is stable.  Found to have Fe deficiency anemia.  Fe supplement started.  No c/o pain.  Continues on IVF.  Diet advanced today.  Pt rested all day.  Spoke w/son and reviewed Dr's note.  Plan is for pt to be d/ced back to facility on Monday.

## 2015-12-21 NOTE — Progress Notes (Signed)
[facility] [place] NAME: Theresa Hood    MR#:  [id number]  DATE OF BIRTH:  03-06-1925  SUBJECTIVE:  CHIEF COMPLAINT:   Chief Complaint  Patient presents with  . Diarrhea  . Dehydration   The patient is 82 year old female with past medical history significant for history of diabetes, hypertension, hyperlipidemia, coronary artery disease, depression who presents to the hospital with complaints of diarrheal stool. Since patient was exposed to other patient in the nursing home with C. difficile colitis and developed diarrhea. She was sent to emergency room for further evaluation. In emergency room, she was noted to be anemic with hemoglobin level of 7.5 and admitted to the hospital for further evaluation and treatment. She denies any more diarrheal stool. Patient was transfused packed blood cells and hemoglobin level improved to 8.3. She feels good today. Urinalysis revealed pyuria, concerning for urinary tract infection, patient was initiated on Rocephin intravenously. Urine culture revealed 100,000 Escherichia coli, pansensitive, now on Keflex orally. Gastrointestinal consultation is requested, supportive therapy recommended. Hemoglobin is stable, Fe studies showed Fe deficiency anemia, fe po started   Review of Systems  Unable to perform ROS: medical condition  Respiratory: Positive for cough.   Gastrointestinal: Positive for diarrhea.    VITAL SIGNS: Blood pressure 124/57, pulse 68, temperature 98.4 F (36.9 C), temperature source Oral, resp. rate 20, height 5\' 8"  (1.727 m), weight 68.493 kg (151 lb), SpO2 100 %.  PHYSICAL EXAMINATION:   GENERAL:  82 y.o.-year-old patient lying in the bed with no acute distress. Globally weak  EYES: Pupils equal, round, reactive to light and accommodation. No scleral icterus. Extraocular muscles intact.  HEENT: Head atraumatic, normocephalic. Oropharynx and nasopharynx clear.  NECK:  Supple, no  jugular venous distention. No thyroid enlargement, no tenderness.  LUNGS: Normal breath sounds bilaterally, no wheezing, rales,rhonchi or crepitation. No use of accessory muscles of respiration.  CARDIOVASCULAR: S1, S2 normal. No murmurs, rubs, or gallops.  ABDOMEN: Soft, nontender, nondistended. Bowel sounds present. No organomegaly or mass.  EXTREMITIES: No pedal edema, cyanosis, or clubbing.  NEUROLOGIC: Cranial nerves II through XII are intact. Muscle strength 3/5 in all extremities. Sensation grossly intact. Gait not checked.  PSYCHIATRIC: The patient is alert and oriented x 1.  SKIN: No obvious rash, lesion, or ulcer.   ORDERS/RESULTS REVIEWED:   CBC  Recent Labs Lab 12/18/15 1007 12/19/15 0657 12/20/15 0617 12/21/15 0602  WBC 5.4 6.0  --   --   HGB 7.5* 8.3* 8.1* 9.1*  HCT 25.6* 27.3*  --   --   PLT 213 193  --   --   MCV 68.3* 70.1*  --   --   MCH 20.0* 21.4*  --   --   MCHC 29.3* 30.6*  --   --   RDW 17.8* 20.2*  --   --    ------------------------------------------------------------------------------------------------------------------  Chemistries   Recent Labs Lab 12/18/15 1007 12/19/15 0657 12/20/15 0617  NA 140 138 135  K 3.9 3.8 3.9  CL 110 113* 111  CO2 21* 21* 19*  GLUCOSE 191* 172* 173*  BUN 23* 19 15  CREATININE 1.00 1.03* 1.03*  CALCIUM 9.1 8.5* 8.0*  AST 18  --   --   ALT 10*  --   --   ALKPHOS 75  --   --   BILITOT 0.5  --   --    ------------------------------------------------------------------------------------------------------------------ estimated creatinine clearance is 38.1 mL/min (by C-G formula based on Cr  of 1.03). ------------------------------------------------------------------------------------------------------------------ No results for input(s): TSH, T4TOTAL, T3FREE, THYROIDAB in the last 72 hours.  Invalid input(s): FREET3  Cardiac Enzymes No results for input(s): CKMB, TROPONINI, MYOGLOBIN in the last 168  hours.  Invalid input(s): CK ------------------------------------------------------------------------------------------------------------------ Invalid input(s): POCBNP ---------------------------------------------------------------------------------------------------------------  RADIOLOGY: No results found.  EKG:  Orders placed or performed in visit on 12/27/09  . EKG 12-Lead    ASSESSMENT AND PLAN:  Active Problems:   GI bleed 1. Symptomatic anemia, status post 1 unit of packed blood cell transfusion ,  improved hemoglobin level, which is stable, anemia is determined to be Fe deficiency, initiate on PO Iron supplments 2. Diarrhea, stool cultures are sent, negative for enteric pathogens and cdiff 3. CK D  stage III , stable 4. urinary tract infection due to Escherichia coli pansensitive , now  Keflex  PO to be continued for 4 more days 5. Diabetes mellitus type 2, continue sliding scale insulin while in the hospital,  resume insulin 70/30, eating about 40 % meals  6. Generalized weakness, physical therapist recommended skilled nursing facility placement, where patient will be discharged on Monday   Management plans discussed with the patient, family and they are in agreement.   DRUG ALLERGIES:  Allergies  Allergen Reactions  . Vicodin [Hydrocodone-Acetaminophen]     CODE STATUS:     Code Status Orders        Start     Ordered   12/18/15 1539  Full code   Continuous     12/18/15 1539    Code Status History    Date Active Date Inactive Code Status Order ID Comments User Context   This patient has a current code status but no historical code status.    Advance Directive Documentation        Most Recent Value   Type of Advance Directive  Out of facility DNR (pink MOST or yellow form)   Pre-existing out of facility DNR order (yellow form or pink MOST form)     "MOST" Form in Place?        TOTAL TIME TAKING CARE OF THIS PATIENT: 35 minutes.   Discussed with  care management Deem Marmol M.D on 12/21/2015 at 3:09 PM  Between 7am to 6pm - Pager - [phone]  After 6pm go to [email or web address] - password [email or web address] [facility]  Office  [phone]  CC: Primary care physician; Crecencio Mc, MD

## 2015-12-22 LAB — GLUCOSE, CAPILLARY
GLUCOSE-CAPILLARY: 189 mg/dL — AB (ref 65–99)
Glucose-Capillary: 162 mg/dL — ABNORMAL HIGH (ref 65–99)
Glucose-Capillary: 159 mg/dL — ABNORMAL HIGH (ref 65–99)
Glucose-Capillary: 152 mg/dL — ABNORMAL HIGH (ref 65–99)

## 2015-12-22 MED ORDER — GLUCERNA SHAKE PO LIQD
237.0000 mL | Freq: Three times a day (TID) | ORAL | Status: DC
  Administered 2015-12-22 – 2015-12-23 (×3): 237 mL via ORAL

## 2015-12-22 NOTE — Progress Notes (Signed)
[facility] [place] NAME: Theresa Hood    MR#:  [id number]  DATE OF BIRTH:  20-Jul-1925  SUBJECTIVE:  CHIEF COMPLAINT:   Chief Complaint  Patient presents with  . Diarrhea  . Dehydration   The patient is 82 year old female with past medical history significant for history of diabetes, hypertension, hyperlipidemia, coronary artery disease, depression who presents to the hospital with complaints of diarrheal stool. Since patient was exposed to other patient in the nursing home with C. difficile colitis and developed diarrhea. She was sent to emergency room for further evaluation. In emergency room, she was noted to be anemic with hemoglobin level of 7.5 and admitted to the hospital for further evaluation and treatment. She denies any more diarrheal stool. Patient was transfused packed blood cells and hemoglobin level improved to 8.3. She feels good today. Urinalysis revealed pyuria, concerning for urinary tract infection, patient was initiated on Rocephin intravenously. Urine culture revealed 100,000 Escherichia coli, pansensitive, now on Keflex orally. Gastrointestinal consultation is requested, supportive therapy recommended. Hemoglobin is stable, Fe studies showed Fe deficiency anemia, fe po started. The patient feels good today. Denies any discomfort   Review of Systems  Unable to perform ROS: medical condition  Respiratory: Positive for cough.   Gastrointestinal: Positive for diarrhea.    VITAL SIGNS: Blood pressure 138/49, pulse 72, temperature 98.1 F (36.7 C), temperature source Oral, resp. rate 20, height 5\' 8"  (1.727 m), weight 68.493 kg (151 lb), SpO2 100 %.  PHYSICAL EXAMINATION:   GENERAL:  82 y.o.-year-old patient lying in the bed with no acute distress. Globally weak  EYES: Pupils equal, round, reactive to light and accommodation. No scleral icterus. Extraocular muscles intact.  HEENT: Head atraumatic, normocephalic.  Oropharynx and nasopharynx clear.  NECK:  Supple, no jugular venous distention. No thyroid enlargement, no tenderness.  LUNGS: Normal breath sounds bilaterally, no wheezing, rales,rhonchi or crepitation. No use of accessory muscles of respiration.  CARDIOVASCULAR: S1, S2 normal. No murmurs, rubs, or gallops.  ABDOMEN: Soft, nontender, nondistended. Bowel sounds present. No organomegaly or mass.  EXTREMITIES: No pedal edema, cyanosis, or clubbing.  NEUROLOGIC: Cranial nerves II through XII are intact. Muscle strength 3/5 in all extremities. Sensation grossly intact. Gait not checked.  PSYCHIATRIC: The patient is alert and oriented x 1.  SKIN: No obvious rash, lesion, or ulcer.   ORDERS/RESULTS REVIEWED:   CBC  Recent Labs Lab 12/18/15 1007 12/19/15 0657 12/20/15 0617 12/21/15 0602  WBC 5.4 6.0  --   --   HGB 7.5* 8.3* 8.1* 9.1*  HCT 25.6* 27.3*  --   --   PLT 213 193  --   --   MCV 68.3* 70.1*  --   --   MCH 20.0* 21.4*  --   --   MCHC 29.3* 30.6*  --   --   RDW 17.8* 20.2*  --   --    ------------------------------------------------------------------------------------------------------------------  Chemistries   Recent Labs Lab 12/18/15 1007 12/19/15 0657 12/20/15 0617  NA 140 138 135  K 3.9 3.8 3.9  CL 110 113* 111  CO2 21* 21* 19*  GLUCOSE 191* 172* 173*  BUN 23* 19 15  CREATININE 1.00 1.03* 1.03*  CALCIUM 9.1 8.5* 8.0*  AST 18  --   --   ALT 10*  --   --   ALKPHOS 75  --   --   BILITOT 0.5  --   --    ------------------------------------------------------------------------------------------------------------------ estimated creatinine clearance is  38.1 mL/min (by C-G formula based on Cr of 1.03). ------------------------------------------------------------------------------------------------------------------ No results for input(s): TSH, T4TOTAL, T3FREE, THYROIDAB in the last 72 hours.  Invalid input(s): FREET3  Cardiac Enzymes No results for input(s):  CKMB, TROPONINI, MYOGLOBIN in the last 168 hours.  Invalid input(s): CK ------------------------------------------------------------------------------------------------------------------ Invalid input(s): POCBNP ---------------------------------------------------------------------------------------------------------------  RADIOLOGY: No results found.  EKG:  Orders placed or performed in visit on 12/27/09  . EKG 12-Lead    ASSESSMENT AND PLAN:  Active Problems:   GI bleed 1. Symptomatic iron deficiency anemia , status post 1 unit of packed blood cell transfusion ,  improved hemoglobin level, which is stable, anemia is determined to be Fe deficiency, initiated on PO Iron supplments 2. Diarrhea, stool cultures are negative for enteric pathogens and cdiff 3. CK D  stage III , stable 4. urinary tract infection due to Escherichia coli pansensitive , continue Keflex  PO  for 3 more days 5. Diabetes mellitus type 2, continue sliding scale insulin while in the hospital,  resumed insulin 70/30, eating about 40 % meals , blood glucose is ranging between 150s to 160s 6. Generalized weakness, physical therapist recommended skilled nursing facility placement, where patient will be discharged on Monday   Management plans discussed with the patient, family and they are in agreement.   DRUG ALLERGIES:  Allergies  Allergen Reactions  . Vicodin [Hydrocodone-Acetaminophen]     CODE STATUS:     Code Status Orders        Start     Ordered   12/18/15 1539  Full code   Continuous     12/18/15 1539    Code Status History    Date Active Date Inactive Code Status Order ID Comments User Context   This patient has a current code status but no historical code status.    Advance Directive Documentation        Most Recent Value   Type of Advance Directive  Out of facility DNR (pink MOST or yellow form)   Pre-existing out of facility DNR order (yellow form or pink MOST form)     "MOST" Form in  Place?        TOTAL TIME TAKING CARE OF THIS PATIENT: 35 minutes.   Discussed with care management Kery Haltiwanger M.D on 12/22/2015 at 3:15 PM  Between 7am to 6pm - Pager - [phone]  After 6pm go to [email or web address] - password [email or web address] [facility]  Office  [phone]  CC: Primary care physician; Crecencio Mc, MD

## 2015-12-22 NOTE — Consult Note (Signed)
GI Inpatient Follow-up Note  Patient Identification: Theresa Hood is a 82 y.o. female who is pleasantly demented, with pmh of htn and cad  Subjective: As of yesterday, her hgb had improved to 9.1, no new measurement today. Her stool was heme negative.   She is a poor historian.  She denies any pain.  Is noted on her chart that she is Fe deficient, was started on iron supplementation TID.  Scheduled Inpatient Medications:  . amLODipine  5 mg Oral Daily  . cephALEXin  500 mg Oral Q12H  . donepezil  10 mg Oral QHS  . ergocalciferol  50,000 Units Oral Weekly  . feeding supplement (GLUCERNA SHAKE)  237 mL Oral TID BM  . ferrous sulfate  220 mg Oral TID WC  . insulin aspart  0-5 Units Subcutaneous QHS  . insulin aspart  0-9 Units Subcutaneous TID WC  . insulin aspart protamine- aspart  10 Units Subcutaneous BID WC  . mirtazapine  30 mg Oral QHS  . topiramate  25 mg Oral Daily    Continuous Inpatient Infusions:   . 0.9 % NaCl with KCl 20 mEq / L 50 mL/hr at 12/22/15 1119    PRN Inpatient Medications:  acetaminophen **OR** acetaminophen, benzonatate, LORazepam, magnesium hydroxide, ondansetron **OR** ondansetron (ZOFRAN) IV, traMADol  Review of Systems: Unable to assess due to dementia    Physical Examination: BP 138/49 mmHg  Pulse 72  Temp(Src) 98.1 F (36.7 C) (Oral)  Resp 20  Ht 5\' 8"  (1.727 m)  Wt 68.493 kg (151 lb)  BMI 22.96 kg/m2  SpO2 100% Gen: NAD, pleasantly demented HEENT: PEERLA, EOMI, Neck: supple, no JVD or thyromegaly Chest: CTA bilaterally, no wheezes, crackles, or other adventitious sounds CV: RRR, no m/g/c/r Abd: soft, NT, ND, +BS in all four quadrants; no HSM, guarding, ridigity, or rebound tenderness Ext: no edema, well perfused with 2+ pulses, Skin: no rash or lesions noted Lymph: no LAD  Data: Lab Results  Component Value Date   WBC 6.0 12/19/2015   HGB 9.1* 12/21/2015   HCT 27.3* 12/19/2015   MCV 70.1* 12/19/2015   PLT 193 12/19/2015     Recent Labs Lab 12/19/15 0657 12/20/15 0617 12/21/15 0602  HGB 8.3* 8.1* 9.1*   Lab Results  Component Value Date   NA 135 12/20/2015   K 3.9 12/20/2015   CL 111 12/20/2015   CO2 19* 12/20/2015   BUN 15 12/20/2015   CREATININE 1.03* 12/20/2015   Lab Results  Component Value Date   ALT 10* 12/18/2015   AST 18 12/18/2015   ALKPHOS 75 12/18/2015   BILITOT 0.5 12/18/2015   No results for input(s): APTT, INR, PTT in the last 168 hours. Assessment/Plan: Theresa Hood is a 82 y.o. female with dementia, htn, cad, anemia. hgb improved from 8.1 to 9.1( no new measurement today), neg c diff. Is noted on her chart to have IDA, was started on iron supplements TID  Recommendations: Monitor stools for constipation since starting iron supplementation, give stool softener if needed.  No other new GI recommendations at this time.  We will continue to follow with you.  Please call with questions or concerns.  Salvadore Farber, PA-C  I personally performed these services.

## 2015-12-22 NOTE — Plan of Care (Signed)
Problem: Education: Goal: Knowledge of [facility] Education information/materials will improve Outcome: Progressing Pt is alert to self, no c/o pain at this time. High Fall Risk, encouraged to call for assistance. Hourly rounding. Incontinent of urine.  PO antibiotics continued. Receiving NS with potassium at 100 ml/hr.

## 2015-12-23 DIAGNOSIS — E1169 Type 2 diabetes mellitus with other specified complication: Secondary | ICD-10-CM

## 2015-12-23 DIAGNOSIS — N183 Chronic kidney disease, stage 3 unspecified: Secondary | ICD-10-CM

## 2015-12-23 DIAGNOSIS — R197 Diarrhea, unspecified: Secondary | ICD-10-CM

## 2015-12-23 DIAGNOSIS — E669 Obesity, unspecified: Secondary | ICD-10-CM

## 2015-12-23 DIAGNOSIS — R531 Weakness: Secondary | ICD-10-CM

## 2015-12-23 DIAGNOSIS — G9341 Metabolic encephalopathy: Secondary | ICD-10-CM

## 2015-12-23 DIAGNOSIS — N3001 Acute cystitis with hematuria: Secondary | ICD-10-CM

## 2015-12-23 DIAGNOSIS — A498 Other bacterial infections of unspecified site: Secondary | ICD-10-CM

## 2015-12-23 LAB — HEMOGLOBIN: Hemoglobin: 9 g/dL — ABNORMAL LOW (ref 12.0–16.0)

## 2015-12-23 LAB — GLUCOSE, CAPILLARY
GLUCOSE-CAPILLARY: 170 mg/dL — AB (ref 65–99)
GLUCOSE-CAPILLARY: 226 mg/dL — AB (ref 65–99)

## 2015-12-23 MED ORDER — CEPHALEXIN 500 MG PO CAPS: 500.0000 mg | ORAL_CAPSULE | Freq: Two times a day (BID) | ORAL | Status: DC

## 2015-12-23 MED ORDER — GLUCERNA SHAKE PO LIQD: 237.0000 mL | Freq: Three times a day (TID) | ORAL | Status: DC

## 2015-12-23 MED ORDER — FERROUS SULFATE 220 (44 FE) MG/5ML PO ELIX: 220.0000 mg | ORAL_SOLUTION | Freq: Three times a day (TID) | ORAL | Status: DC

## 2015-12-23 MED ORDER — TRAMADOL HCL 50 MG PO TABS: 50.0000 mg | ORAL_TABLET | Freq: Three times a day (TID) | ORAL | Status: DC | PRN

## 2015-12-23 NOTE — Clinical Social Work Note (Signed)
MD to discharge patient today. Multiple attempts made to reach family were unsuccessful so CSW contacted Horris Latino at [facility] to ask for any other numbers. Horris Latino provided a number for another son: Juanda Crumble: [phone]. CSW was able to reach [place] and he stated that the family did not want STR as recommended by PT and that they wish for patient to return to [facility] Bonnie at [facility] states she can take patient back. Nurse to call report. Patient to transport via EMS.  Shela Leff MSW,LCSW [phone]

## 2015-12-23 NOTE — Consult Note (Signed)
GI Inpatient Follow-up Note  Patient Identification: Theresa Hood is a 82 y.o. female who is pleasantly demented, with pmh of htn and cad  Subjective: She denies any pain, no new developments.  She is a poor historian.  Is continuing iron supplementation   Scheduled Inpatient Medications:  . amLODipine  5 mg Oral Daily  . cephALEXin  500 mg Oral Q12H  . donepezil  10 mg Oral QHS  . ergocalciferol  50,000 Units Oral Weekly  . feeding supplement (GLUCERNA SHAKE)  237 mL Oral TID BM  . ferrous sulfate  220 mg Oral TID WC  . insulin aspart  0-5 Units Subcutaneous QHS  . insulin aspart  0-9 Units Subcutaneous TID WC  . insulin aspart protamine- aspart  10 Units Subcutaneous BID WC  . mirtazapine  30 mg Oral QHS  . topiramate  25 mg Oral Daily    Continuous Inpatient Infusions:     PRN Inpatient Medications:  acetaminophen **OR** acetaminophen, benzonatate, LORazepam, magnesium hydroxide, ondansetron **OR** ondansetron (ZOFRAN) IV, traMADol  Review of Systems: Unable to assess due to dementia   Physical Examination: BP 106/44 mmHg  Pulse 59  Temp(Src) 98.1 F (36.7 C) (Oral)  Resp 16  Ht 5\' 8"  (1.727 m)  Wt 68.493 kg (151 lb)  BMI 22.96 kg/m2  SpO2 94% Gen: NAD, pleasantly demented HEENT: PEERLA, EOMI, Neck: supple, no JVD or thyromegaly Chest: CTA bilaterally, no wheezes, crackles, or other adventitious sounds CV: RRR, no m/g/c/r Abd: soft, NT, ND, +BS in all four quadrants; no HSM, guarding, ridigity, or rebound tenderness Ext: no edema, well perfused with 2+ pulses, Skin: no rash or lesions noted Lymph: no LAD  Data: Lab Results  Component Value Date   WBC 6.0 12/19/2015   HGB 9.0* 12/23/2015   HCT 27.3* 12/19/2015   MCV 70.1* 12/19/2015   PLT 193 12/19/2015    Recent Labs Lab 12/20/15 0617 12/21/15 0602 12/23/15 0447  HGB 8.1* 9.1* 9.0*   Lab Results  Component Value Date   NA 135 12/20/2015   K 3.9 12/20/2015   CL 111 12/20/2015   CO2 19*  12/20/2015   BUN 15 12/20/2015   CREATININE 1.03* 12/20/2015   Lab Results  Component Value Date   ALT 10* 12/18/2015   AST 18 12/18/2015   ALKPHOS 75 12/18/2015   BILITOT 0.5 12/18/2015   No results for input(s): APTT, INR, PTT in the last 168 hours. Assessment/Plan: Theresa Hood is a 82 y.o. female with dementia, htn, cad, anemia. hgb improved from 8.1 to 9.1( no new measurement today), neg c diff. Is noted on her chart to have IDA, was started on iron supplements TID  No new labs today  She is going to be discharged today    Recommendations: We agree with current treatment plan.  We will continue to follow with you. Please call with questions or concerns.  Salvadore Farber, PA-C  I personally performed these services.

## 2015-12-23 NOTE — Progress Notes (Signed)
Physical Therapy Treatment Patient Details Name: Theresa Hood MRN: [id number] DOB: 1925/07/12 Today's Date: 12/23/2015    History of Present Illness 82 yo female with onset of GI bleed and UTI was admitted, has been transfused, has enteric precautions.   has PMHx:  L eye enucleated, DM, HTN, CAD, dementia.    PT Comments    Pt is making slow progress towards goals with increased tolerance for there-ex. Pt able to participate and follow commands assisting with OOB mobility. Pt able to stand for brief period of time in order to better position in bed. Heavy assist needed. Use RW for further mobility. Pt motivated to participate.  Follow Up Recommendations  SNF     Equipment Recommendations       Recommendations for Other Services       Precautions / Restrictions Precautions Precautions: Fall Restrictions Weight Bearing Restrictions: No    Mobility  Bed Mobility Overal bed mobility: Needs Assistance Bed Mobility: Supine to Sit;Sit to Supine     Supine to sit: Mod assist;HOB elevated Sit to supine: Mod assist   General bed mobility comments: assist given for trunk stability and sliding B feet off of bed. Once seated at EOB, pt able to maintain seated balance with min assist; progressing to cga. 2 LOB noted while seated in post lateral direction with min assist for correction  Transfers Overall transfer level: Needs assistance Equipment used: None Transfers: Sit to/from Stand Sit to Stand: Max assist         General transfer comment: attempt for squat standing in order to scoot up towards [place]. Bed elevated and HHA given. Pt able to stand for short time and therapist assisted sliding hips up towards [place] II. 2 attempts performed, pt unable to stand longer than 5 seconds. Further mobility deferred  Ambulation/Gait             General Gait Details: unable   Stairs            Wheelchair Mobility    Modified Rankin (Stroke Patients Only)       Balance                                     Cognition Arousal/Alertness: Awake/alert Behavior During Therapy: WFL for tasks assessed/performed Overall Cognitive Status: History of cognitive impairments - at baseline                      Exercises Other Exercises Other Exercises: Supine ther-ex performed including B LE SAQ, LAQ, hip abd/add, hip add squeezes, bridging, and SLRs. All ther-ex performed x 10-12 reps with min assist. Pain noted with L knee flexion; limited to tolerance. Pt able performed B UE shoulder flexion x 10 reps.    General Comments        Pertinent Vitals/Pain Pain Assessment: Faces Faces Pain Scale: Hurts a little bit Pain Location: L patella Pain Descriptors / Indicators: Aching Pain Intervention(s): Limited activity within patient's tolerance    Home Living                      Prior Function            PT Goals (current goals can now be found in the care plan section) Acute Rehab PT Goals Patient Stated Goal: make her L leg feel better PT Goal Formulation: Patient unable to participate in goal setting Time For Goal  Achievement: 01/02/16 Potential to Achieve Goals: Good Progress towards PT goals: Progressing toward goals    Frequency  Min 2X/week    PT Plan Current plan remains appropriate    Co-evaluation             End of Session Equipment Utilized During Treatment: Gait belt Activity Tolerance: Patient tolerated treatment well Patient left: in bed;with bed alarm set     Time: [id number] PT Time Calculation (min) (ACUTE ONLY): 12 min  Charges:  $Therapeutic Exercise: 8-22 mins                    G Codes:      Jacora Hopkins 12-31-2015, 9:51 AM Greggory Stallion, PT, DPT [phone]

## 2015-12-23 NOTE — Care Management Important Message (Signed)
Important Message  Patient Details  Name: Sonia Thomure MRN: [id number] Date of Birth: 04-11-1925   Medicare Important Message Given:  Yes    Juliann Pulse A Jadarrius Maselli 12/23/2015, 10:20 AM

## 2015-12-23 NOTE — Discharge Summary (Signed)
[facility] NAME: Theresa Hood    MR#:  [id number]  DATE OF BIRTH:  11/10/1925  DATE OF ADMISSION:  12/18/2015 ADMITTING PHYSICIAN: Henreitta Leber, MD  DATE OF DISCHARGE: 16th of January 2017  PRIMARY CARE PHYSICIAN: Crecencio Mc, MD     ADMISSION DIAGNOSIS:  UTI (lower urinary tract infection) [N39.0] Symptomatic anemia [D64.9] Diarrhea, unspecified type [R19.7]  DISCHARGE DIAGNOSIS:  Principal Problem:   Anemia, iron deficiency Active Problems:   Acute cystitis with hematuria   Escherichia coli infection   CKD (chronic kidney disease), stage III   Generalized weakness   Encephalopathy, metabolic   Diarrhea   Diabetes mellitus type 2 in obese ([place])   SECONDARY DIAGNOSIS:   Past Medical History  Diagnosis Date  . Diabetes mellitus   . Hypertension   . Hyperlipidemia   . Coronary artery disease   . 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 Depression, major, in remission ([place])   . Dementia arising in the senium and presenium     .pro HOSPITAL COURSE:   The patient is 82 year old female with past medical history significant for history of diabetes, hypertension, hyperlipidemia, coronary artery disease, depression who presents to the hospital with complaints of diarrheal stool. Since patient was exposed to other patient in the nursing home with C. difficile colitis and developed diarrhea. She was sent to emergency room for further evaluation. In emergency room, she was noted to be anemic with hemoglobin level of 7.5 and admitted to the hospital for further evaluation and treatment. Stool cultures were checked and they were negative for C. difficile or enteric pathogens. With conservative therapy, diarrhea, resolved.  Patient was transfused 1 unit of packed blood cells and hemoglobin level improved to 8.3.  Urinalysis revealed pyuria and hematuria, concerning for urinary tract infection, patient  was initiated on Rocephin intravenously. Urine culture revealed 100,000 Escherichia coli, pansensitive, Rocephin was changed to Keflex orally, recommended to continue for 7 days total. Gastrointestinal consultation was requested for anemia, but only supportive therapy recommended, no interventions, , Iron supplementation was initiated orally. . hemoglobin level remained stable after transfusion . Patient was felt to be stable to be discharged to skilled nursing facility   discussion by problem 1. Symptomatic iron deficiency anemia , status post 1 unit of packed blood cell transfusion , improved hemoglobin level, which is stable, anemia is determined to be Fe deficiency, initiated on PO Iron supplements, gastroenterologist does not recommend any further intervention, no bleeding was noted while she was in the hospital and patient's anemia could have been related to hematuria 2. Diarrhea, stool cultures are negative for enteric pathogens and cdiff 3. CK D stage III , stable 4. urinary tract infection due to Escherichia coli pansensitive , continue Keflex PO for 6 more days to complete a course, patient may benefit from urology evaluation, if hematuria continues  5. Diabetes mellitus type 2, continue sliding scale insulin while in the hospital, resumed insulin 70/30, eating about 40 % meals , blood glucose is ranging between 150s to 200s 6. Generalized weakness, physical therapist recommended skilled nursing facility placement, where patient will be discharged on Monday 7. Metabolic encephalopathy due to urinary tract infection, resolved  DISCHARGE CONDITIONS:  Stable   Treatment Team:  Manya Silvas, MD  DRUG ALLERGIES:   Allergies  Allergen Reactions  . Vicodin [Hydrocodone-Acetaminophen]     DISCHARGE MEDICATIONS:   Current Discharge Medication List  START taking these medications   Details  cephALEXin (KEFLEX) 500 MG capsule Take 1 capsule (500 mg total) by mouth every 12  (twelve) hours. Qty: 14 capsule, Refills: 0    feeding supplement, GLUCERNA SHAKE, (GLUCERNA SHAKE) LIQD Take 237 mLs by mouth 3 (three) times daily between meals. Qty: 90 Can, Refills: 6    ferrous sulfate 220 (44 Fe) MG/5ML solution Take 5 mLs (220 mg total) by mouth 3 (three) times daily with meals. Qty: 150 mL, Refills: 3      CONTINUE these medications which have CHANGED   Details  traMADol (ULTRAM) 50 MG tablet Take 1 tablet (50 mg total) by mouth every 8 (eight) hours as needed. Qty: 30 tablet, Refills: 0   Associated Diagnoses: Left knee pain      CONTINUE these medications which have NOT CHANGED   Details  !! acetaminophen (TYLENOL) 325 MG tablet Take 650 mg by mouth every 6 (six) hours as needed.    amLODipine (NORVASC) 5 MG tablet Take 1 tablet (5 mg total) by mouth daily. Qty: 90 tablet, Refills: 3    benzonatate (TESSALON) 200 MG capsule Take 200 mg by mouth 3 (three) times daily as needed for cough.    donepezil (ARICEPT) 10 MG tablet Take 10 mg by mouth at bedtime.    ergocalciferol (DRISDOL) 50000 UNITS capsule Take 1 capsule (50,000 Units total) by mouth once a week. Qty: 12 capsule, Refills: 1    hydrocortisone (ANUSOL-HC) 2.5 % rectal cream Place 1 application rectally 2 (two) times daily. Qty: 30 g, Refills: 2    insulin aspart (NOVOLOG) 100 UNIT/ML injection Give before lunch sliding scale: CBG  151 to 200: Give 3 units; CBG 201 to 250:  Give  6 units; CBG 251 to 300  Give 10 units, CBG 301 to 350   Give 14 units Qty: 3 vial, Refills: PRN    insulin NPH-regular Human (NOVOLIN 70/30) (70-30) 100 UNIT/ML injection 10 UNITS BEFORE BREAKFAST,  INCREASE TO 15 FOR bx > 250 And 18 units before dinner  Hold either  dose for BS <  130 Qty: 10 mL, Refills: 12    Lactobacillus-Inulin (CULTURELLE DIGESTIVE HEALTH) CAPS Take 1 capsule by mouth daily. Qty: 30 capsule, Refills: 5    loperamide (IMODIUM) 2 MG capsule Take 2 capsules (4 mg total) by mouth as needed  for diarrhea or loose stools (max 6 tablets daily). Qty: 30 capsule, Refills: 5    LORazepam (ATIVAN) 0.5 MG tablet Take 1 tablet (0.5 mg total) by mouth 2 (two) times daily as needed for anxiety or sleep. Qty: 60 tablet, Refills: 4    magnesium hydroxide (MILK OF MAGNESIA) 400 MG/5ML suspension Take 30 mLs by mouth daily as needed.    !! MAPAP 500 MG tablet TAKE 1 CAPLET BY MOUTH EVERY 8 HOURS Qty: 90 tablet, Refills: 11    mirtazapine (REMERON) 30 MG tablet Take 1 tablet (30 mg total) by mouth at bedtime. Qty: 30 tablet, Refills: 5    ranitidine (ZANTAC) 75 MG tablet Take 75 mg by mouth as needed for heartburn.    topiramate (TOPAMAX) 25 MG tablet Take 25 mg by mouth daily.    conjugated estrogens (PREMARIN) vaginal cream 1  Gram inserted into vagina  twice weekly Qty: 42.5 g, Refills: 12    White Petrolatum-Mineral Oil (SYSTANE NIGHTTIME) OINT ADMINISTER TO SURFACE OF PROSTHESIS WITH Q-TIP AS NEEDED BEFORE BEDTIME Qty: 3.5 g, Refills: 9     !! - Potential duplicate medications found.  Please discuss with provider.    STOP taking these medications     amoxicillin (AMOXIL) 500 MG capsule      clopidogrel (PLAVIX) 75 MG tablet      ciprofloxacin (CIPRO) 250 MG tablet          DISCHARGE INSTRUCTIONS:    patient is to follow-up with primary care physician and urologist as outpatient if hematuria continuous  If you experience worsening of your admission symptoms, develop shortness of breath, life threatening emergency, suicidal or homicidal thoughts you must seek medical attention immediately by calling 911 or calling your MD immediately  if symptoms less severe.  You Must read complete instructions/literature along with all the possible adverse reactions/side effects for all the Medicines you take and that have been prescribed to you. Take any new Medicines after you have completely understood and accept all the possible adverse reactions/side effects.   Please note  You  were cared for by a hospitalist during your hospital stay. If you have any questions about your discharge medications or the care you received while you were in the hospital after you are discharged, you can call the unit and asked to speak with the hospitalist on call if the hospitalist that took care of you is not available. Once you are discharged, your primary care physician will handle any further medical issues. Please note that NO REFILLS for any discharge medications will be authorized once you are discharged, as it is imperative that you return to your primary care physician (or establish a relationship with a primary care physician if you do not have one) for your aftercare needs so that they can reassess your need for medications and monitor your lab values.    Today   CHIEF COMPLAINT:   Chief Complaint  Patient presents with  . Diarrhea  . Dehydration    HISTORY OF PRESENT ILLNESS:  Theresa Hood  is a 82 y.o. female with a known history of  DIABETES, HYPERTENSION, HYPERLIPIDEMIA, CORONARY ARTERY DISEASE, LEFT EYE MELANOMA STATUS POST ENUCLEATION, DEPRESSION, DEMENTIA WHO presents to the hospital with complaints of diarrheal stool. Since patient was exposed to other patient in the nursing home with C. difficile colitis and developed diarrhea. She was sent to emergency room for further evaluation. In emergency room, she was noted to be anemic with hemoglobin level of 7.5 and admitted to the hospital for further evaluation and treatment. Stool cultures were checked and they were negative for C. difficile or enteric pathogens. With conservative therapy, diarrhea, resolved.  Patient was transfused 1 unit of packed blood cells and hemoglobin level improved to 8.3.  Urinalysis revealed pyuria and hematuria, concerning for urinary tract infection, patient was initiated on Rocephin intravenously. Urine culture revealed 100,000 Escherichia coli, pansensitive, Rocephin was changed to Keflex orally,  recommended to continue for 7 days total. Gastrointestinal consultation was requested for anemia, but only supportive therapy recommended, no interventions, , Iron supplementation was initiated orally. . hemoglobin level remained stable after transfusion . Patient was felt to be stable to be discharged to skilled nursing facility   discussion by problem 1. Symptomatic iron deficiency anemia , status post 1 unit of packed blood cell transfusion , improved hemoglobin level, which is stable, anemia is determined to be Fe deficiency, initiated on PO Iron supplements, gastroenterologist does not recommend any further intervention, no bleeding was noted while she was in the hospital and patient's anemia could have been related to hematuria 2. Diarrhea, stool cultures are negative for enteric pathogens and  cdiff 3. CK D stage III , stable 4. urinary tract infection due to Escherichia coli pansensitive , continue Keflex PO for 6 more days to complete a course, patient may benefit from urology evaluation, if hematuria continues  5. Diabetes mellitus type 2, continue sliding scale insulin while in the hospital, resumed insulin 70/30, eating about 40 % meals , blood glucose is ranging between 150s to 200s 6. Generalized weakness, physical therapist recommended skilled nursing facility placement, where patient will be discharged on Monday 7. Metabolic encephalopathy due to urinary tract infection, resolved   VITAL SIGNS:  Blood pressure 106/44, pulse 59, temperature 98.1 F (36.7 C), temperature source Oral, resp. rate 16, height 5\' 8"  (1.727 m), weight 68.493 kg (151 lb), SpO2 94 %.  I/O:   Intake/Output Summary (Last 24 hours) at 12/23/15 1205 Last data filed at 12/23/15 0800  Gross per 24 hour  Intake    360 ml  Output      0 ml  Net    360 ml    PHYSICAL EXAMINATION:  GENERAL:  82 y.o.-year-old patient lying in the bed with no acute distress.  EYES: Pupils equal, round, reactive to light and  accommodation. No scleral icterus. Extraocular muscles intact.  HEENT: Head atraumatic, normocephalic. Oropharynx and nasopharynx clear.  NECK:  Supple, no jugular venous distention. No thyroid enlargement, no tenderness.  LUNGS: Normal breath sounds bilaterally, no wheezing, rales,rhonchi or crepitation. No use of accessory muscles of respiration.  CARDIOVASCULAR: S1, S2 normal. No murmurs, rubs, or gallops.  ABDOMEN: Soft, non-tender, non-distended. Bowel sounds present. No organomegaly or mass.  EXTREMITIES: No pedal edema, cyanosis, or clubbing.  NEUROLOGIC: Cranial nerves II through XII are intact. Muscle strength 5/5 in all extremities. Sensation intact. Gait not checked.  PSYCHIATRIC: The patient is alert and oriented x 3.  SKIN: No obvious rash, lesion, or ulcer.   DATA REVIEW:   CBC  Recent Labs Lab 12/19/15 0657  12/23/15 0447  WBC 6.0  --   --   HGB 8.3*  < > 9.0*  HCT 27.3*  --   --   PLT 193  --   --   < > = values in this interval not displayed.  Chemistries   Recent Labs Lab 12/18/15 1007  12/20/15 0617  NA 140  < > 135  K 3.9  < > 3.9  CL 110  < > 111  CO2 21*  < > 19*  GLUCOSE 191*  < > 173*  BUN 23*  < > 15  CREATININE 1.00  < > 1.03*  CALCIUM 9.1  < > 8.0*  AST 18  --   --   ALT 10*  --   --   ALKPHOS 75  --   --   BILITOT 0.5  --   --   < > = values in this interval not displayed.  Cardiac Enzymes No results for input(s): TROPONINI in the last 168 hours.  Microbiology Results  Results for orders placed or performed during the hospital encounter of 12/18/15  Urine culture     Status: None   Collection Time: 12/18/15 10:07 AM  Result Value Ref Range Status   Specimen Description URINE, RANDOM  Final   Special Requests NONE  Final   Culture >=100,000 COLONIES/mL ESCHERICHIA COLI  Final   Report Status 12/20/2015 FINAL  Final   Organism ID, Bacteria ESCHERICHIA COLI  Final      Susceptibility   Escherichia coli - MIC*  AMPICILLIN <=2  SENSITIVE Sensitive     CEFTAZIDIME <=1 SENSITIVE Sensitive     CEFAZOLIN <=4 SENSITIVE Sensitive     CEFTRIAXONE <=1 SENSITIVE Sensitive     CIPROFLOXACIN >=4 RESISTANT Resistant     GENTAMICIN <=1 SENSITIVE Sensitive     IMIPENEM <=0.25 SENSITIVE Sensitive     TRIMETH/SULFA <=20 SENSITIVE Sensitive     Extended ESBL NEGATIVE Sensitive     PIP/TAZO Value in next row Sensitive      SENSITIVE<=4    NITROFURANTOIN Value in next row Sensitive      SENSITIVE<=16    AMPICILLIN/SULBACTAM Value in next row Sensitive      SENSITIVE<=2    * >=100,000 COLONIES/mL ESCHERICHIA COLI  C difficile quick scan w PCR reflex     Status: None   Collection Time: 12/20/15  2:37 PM  Result Value Ref Range Status   C Diff antigen NEGATIVE NEGATIVE Final   C Diff toxin NEGATIVE NEGATIVE Final   C Diff interpretation Negative for C. difficile  Final  Gastrointestinal Panel by PCR , Stool     Status: None   Collection Time: 12/20/15  2:37 PM  Result Value Ref Range Status   Campylobacter species NOT DETECTED NOT DETECTED Final   Plesimonas shigelloides NOT DETECTED NOT DETECTED Final   Salmonella species NOT DETECTED NOT DETECTED Final   Yersinia enterocolitica NOT DETECTED NOT DETECTED Final   Vibrio species NOT DETECTED NOT DETECTED Final   Vibrio cholerae NOT DETECTED NOT DETECTED Final   Enteroaggregative E coli (EAEC) NOT DETECTED NOT DETECTED Final   Enteropathogenic E coli (EPEC) NOT DETECTED NOT DETECTED Final   Enterotoxigenic E coli (ETEC) NOT DETECTED NOT DETECTED Final   Shiga like toxin producing E coli (STEC) NOT DETECTED NOT DETECTED Final   E. coli O157 NOT DETECTED NOT DETECTED Final   Shigella/Enteroinvasive E coli (EIEC) NOT DETECTED NOT DETECTED Final   Cryptosporidium NOT DETECTED NOT DETECTED Final   Cyclospora cayetanensis NOT DETECTED NOT DETECTED Final   Entamoeba histolytica NOT DETECTED NOT DETECTED Final   Giardia lamblia NOT DETECTED NOT DETECTED Final   Adenovirus F40/41  NOT DETECTED NOT DETECTED Final   Astrovirus NOT DETECTED NOT DETECTED Final   Norovirus GI/GII NOT DETECTED NOT DETECTED Final   Rotavirus A NOT DETECTED NOT DETECTED Final   Sapovirus (I, II, IV, and V) NOT DETECTED NOT DETECTED Final    RADIOLOGY:  No results found.  EKG:   Orders placed or performed in visit on 12/27/09  . EKG 12-Lead      Management plans discussed with the patient, family and they are in agreement.  CODE STATUS:     Code Status Orders        Start     Ordered   12/18/15 1539  Full code   Continuous     12/18/15 1539    Code Status History    Date Active Date Inactive Code Status Order ID Comments User Context   This patient has a current code status but no historical code status.    Advance Directive Documentation        Most Recent Value   Type of Advance Directive  Out of facility DNR (pink MOST or yellow form)   Pre-existing out of facility DNR order (yellow form or pink MOST form)     "MOST" Form in Place?        TOTAL TIME TAKING CARE OF THIS PATIE40 minutes.    Theodoro Grist M.D on  12/23/2015 at 12:05 PM  Between 7am to 6pm - Pager - [phone]  After 6pm go to [email or web address] - password [email or web address] [facility]  Office  [phone]  CC: Primary care physician; Crecencio Mc, MD

## 2015-12-23 NOTE — Plan of Care (Signed)
Problem: Education: Goal: Knowledge of [facility] Education information/materials will improve Outcome: Progressing Pt is alert to self and location. No c/o pain. Incontinent of urine at times. No IV access. Pt is pleasant, cooperative. Good appetite.

## 2015-12-23 NOTE — Progress Notes (Signed)
Patient being discharged to [facility]. Report given to [employer] at facility. Will call EMS for transport.

## 2015-12-23 NOTE — NC FL2 (Signed)
[place] LEVEL OF CARE SCREENING TOOL     IDENTIFICATION  Patient Name: Theresa Hood Birthdate: August 13, 1925 Sex: female Admission Date (Current Location): 12/18/2015  [facility] and [place] Number:  Engineering geologist and Address:  [facility], [address], [place], [place] [address]      Provider Number: [id number]  Attending Physician Name and Address:  Theodoro Grist, MD  Relative Name and Phone Number:       Current Level of Care: Hospital Recommended Level of Care: Memory Care Prior Approval Number:    Date Approved/Denied:   PASRR Number:    Discharge Plan:  (ALF Memory Care)    Current Diagnoses: Patient Active Problem List   Diagnosis Date Noted  . Diarrhea 12/23/2015  . CKD (chronic kidney disease), stage III 12/23/2015  . Acute cystitis with hematuria 12/23/2015  . Escherichia coli infection 12/23/2015  . Diabetes mellitus type 2 in obese ([place]) 12/23/2015  . Generalized weakness 12/23/2015  . Encephalopathy, metabolic [id number]  . Anemia, iron deficiency 12/18/2015  . Left knee pain 07/12/2015  . Medicare annual wellness visit, subsequent 03/24/2015  . Hemorrhoid 04/02/2014  . Essential hypertension 03/28/2014  . Hemorrhoid prolapse 02/12/2014  . UTI (urinary tract infection) 02/09/2014  . Vaginitis, atrophic 09/20/2013  . Urinary frequency 09/25/2012  . Type 2 diabetes mellitus with stage 3 chronic kidney disease ([place])   . Hyperlipidemia   . Coronary artery disease   . Depression, major, in remission ([place])   . Dementia arising in the senium and presenium     Orientation RESPIRATION BLADDER Height & Weight    Self    Incontinent   151 lbs.  BEHAVIORAL SYMPTOMS/MOOD NEUROLOGICAL BOWEL NUTRITION STATUS   (none)  (none) Incontinent Diet  AMBULATORY STATUS COMMUNICATION OF NEEDS Skin   Extensive Assist Verbally                         Personal Care Assistance Level of Assistance  Total care        Total Care Assistance: Maximum assistance   Functional Limitations Info             SPECIAL CARE FACTORS FREQUENCY                       Contractures Contractures Info: Not present    Additional Factors Info                  DISCHARGE MEDICATIONS:   Current Discharge Medication List    START taking these medications   Details  cephALEXin (KEFLEX) 500 MG capsule Take 1 capsule (500 mg total) by mouth every 12 (twelve) hours. Qty: 14 capsule, Refills: 0    feeding supplement, GLUCERNA SHAKE, (GLUCERNA SHAKE) LIQD Take 237 mLs by mouth 3 (three) times daily between meals. Qty: 90 Can, Refills: 6    ferrous sulfate 220 (44 Fe) MG/5ML solution Take 5 mLs (220 mg total) by mouth 3 (three) times daily with meals. Qty: 150 mL, Refills: 3      CONTINUE these medications which have CHANGED   Details  traMADol (ULTRAM) 50 MG tablet Take 1 tablet (50 mg total) by mouth every 8 (eight) hours as needed. Qty: 30 tablet, Refills: 0   Associated Diagnoses: Left knee pain      CONTINUE these medications which have NOT CHANGED   Details  !! acetaminophen (TYLENOL) 325 MG tablet Take 650 mg by mouth every  6 (six) hours as needed.    amLODipine (NORVASC) 5 MG tablet Take 1 tablet (5 mg total) by mouth daily. Qty: 90 tablet, Refills: 3    benzonatate (TESSALON) 200 MG capsule Take 200 mg by mouth 3 (three) times daily as needed for cough.    donepezil (ARICEPT) 10 MG tablet Take 10 mg by mouth at bedtime.    ergocalciferol (DRISDOL) 50000 UNITS capsule Take 1 capsule (50,000 Units total) by mouth once a week. Qty: 12 capsule, Refills: 1    hydrocortisone (ANUSOL-HC) 2.5 % rectal cream Place 1 application rectally 2 (two) times daily. Qty: 30 g, Refills: 2    insulin aspart (NOVOLOG) 100 UNIT/ML injection Give before lunch sliding scale: CBG 151 to 200: Give 3 units; CBG 201 to 250: Give 6 units; CBG 251 to 300 Give  10 units, CBG 301 to 350 Give 14 units Qty: 3 vial, Refills: PRN    insulin NPH-regular Human (NOVOLIN 70/30) (70-30) 100 UNIT/ML injection 10 UNITS BEFORE BREAKFAST, INCREASE TO 15 FOR bx > 250 And 18 units before dinner  Hold either dose for BS < 130 Qty: 10 mL, Refills: 12    Lactobacillus-Inulin (CULTURELLE DIGESTIVE HEALTH) CAPS Take 1 capsule by mouth daily. Qty: 30 capsule, Refills: 5    loperamide (IMODIUM) 2 MG capsule Take 2 capsules (4 mg total) by mouth as needed for diarrhea or loose stools (max 6 tablets daily). Qty: 30 capsule, Refills: 5    LORazepam (ATIVAN) 0.5 MG tablet Take 1 tablet (0.5 mg total) by mouth 2 (two) times daily as needed for anxiety or sleep. Qty: 60 tablet, Refills: 4    magnesium hydroxide (MILK OF MAGNESIA) 400 MG/5ML suspension Take 30 mLs by mouth daily as needed.    !! MAPAP 500 MG tablet TAKE 1 CAPLET BY MOUTH EVERY 8 HOURS Qty: 90 tablet, Refills: 11    mirtazapine (REMERON) 30 MG tablet Take 1 tablet (30 mg total) by mouth at bedtime. Qty: 30 tablet, Refills: 5    ranitidine (ZANTAC) 75 MG tablet Take 75 mg by mouth as needed for heartburn.    topiramate (TOPAMAX) 25 MG tablet Take 25 mg by mouth daily.    conjugated estrogens (PREMARIN) vaginal cream 1 Gram inserted into vagina twice weekly Qty: 42.5 g, Refills: 12    White Petrolatum-Mineral Oil (SYSTANE NIGHTTIME) OINT ADMINISTER TO SURFACE OF PROSTHESIS WITH Q-TIP AS NEEDED BEFORE BEDTIME Qty: 3.5 g, Refills: 9    !! - Potential duplicate medications found. Please discuss with provider.    STOP taking these medications     amoxicillin (AMOXIL) 500 MG capsule      clopidogrel (PLAVIX) 75 MG tablet      ciprofloxacin (CIPRO) 250 MG tablet              Discharge Medications: Please see discharge summary for a list of discharge medications.  Relevant Imaging Results:  Relevant Lab Results:   Additional  Information    Shela Leff, LCSW

## 2015-12-24 LAB — GLUCOSE, CAPILLARY: GLUCOSE-CAPILLARY: 143 mg/dL — AB (ref 65–99)

## 2015-12-24 NOTE — Clinical Social Work Note (Signed)
Horris Latino with [facility] called this morning to say that the FL2 that CSW placed in the packet for patient to to their facility was not found by them upon receipt of patient. CSW faxed over the FL2.  Shela Leff MSW,LCSW [phone]

## 2015-12-26 ENCOUNTER — Telehealth: Payer: Self-pay | Admitting: Internal Medicine

## 2015-12-26 DIAGNOSIS — Z515 Encounter for palliative care: Secondary | ICD-10-CM

## 2015-12-26 NOTE — Telephone Encounter (Signed)
I received a request for Hospice consult from [place] patient's dc from [facility].  Does patient have a hospital follow up?  And before I make the hospcie referral need to hear from patient's family.  shereece socorro is her son

## 2015-12-26 NOTE — Telephone Encounter (Signed)
Hospice referral in place

## 2015-12-26 NOTE — Telephone Encounter (Signed)
Called facility spoke with [facility], request was started by facility for hospice. Due to patient not eating and to continuous weight loss. Patient is completely wheelchair bound, no longer usees walker. Facility is faxing over [employer] for MD to view. Patient was started on Iron during admission for anemia. Left message for patient POA to call office.

## 2015-12-26 NOTE — Telephone Encounter (Signed)
Patient son called back and does agree with Hospice care, Son Theresa Hood is POA number to reach him is [phone], to please cal to night any questio

## 2015-12-26 NOTE — Telephone Encounter (Signed)
Pt son called regarding request for hospice. Theresa Hood number is [phone]. He is returning Dr Derrel Nip call. Thank You!

## 2015-12-27 ENCOUNTER — Telehealth: Payer: Self-pay | Admitting: Internal Medicine

## 2015-12-27 NOTE — Telephone Encounter (Signed)
Spoke with Parke Simmers at the facility, danielle had left for the day.  Order for hospice is in place already.

## 2015-12-27 NOTE — Telephone Encounter (Signed)
[facility] [place] called. They need a referral for hospice. Please call [place] at [phone].

## 2015-12-27 NOTE — Telephone Encounter (Signed)
Please advise 

## 2016-01-16 ENCOUNTER — Other Ambulatory Visit: Payer: Self-pay | Admitting: Internal Medicine

## 2016-03-11 ENCOUNTER — Telehealth: Payer: Self-pay | Admitting: Internal Medicine

## 2016-03-11 NOTE — Telephone Encounter (Signed)
Sheron Nightingale [phone] called from Hospice regarding several diarrhea stools since yesterday. Leigh wanting to know if pt can get medication for diarrhea. Fax order to [phone]. Thank you!

## 2016-03-11 NOTE — Telephone Encounter (Signed)
Not without more info.. Has patient been on an antibiotic recently?  Is she having fevers? Anorexia?

## 2016-03-11 NOTE — Telephone Encounter (Signed)
Can you send in a medication for this?

## 2016-03-12 ENCOUNTER — Other Ambulatory Visit: Payer: Self-pay | Admitting: Internal Medicine

## 2016-03-12 MED ORDER — LOPERAMIDE HCL 1 MG/5ML PO LIQD: 2.0000 mg | ORAL | Status: DC | PRN

## 2016-03-12 NOTE — Telephone Encounter (Signed)
She has Imodium on her chart.  i have refilled it

## 2016-03-12 NOTE — Telephone Encounter (Signed)
Notified Leigh that Imodium has been sent in . Rusty Aus checked Ms Chagoya for impaction and she was not.

## 2016-03-12 NOTE — Telephone Encounter (Signed)
Spoke with Sheron Nightingale from Hospice and she stated that the patient is not having loss stools at every bowel movement. She stated that patient has not been on antibiotic, she is eating normal, and no fever. Per Leigh patient seems lik eher normal self. When she has the loss stool it is a "blow out" and dark. Marliss Czar seems to think that the dark stool is because of the iron supplement.

## 2016-03-21 ENCOUNTER — Other Ambulatory Visit: Payer: Self-pay | Admitting: Internal Medicine

## 2016-04-13 ENCOUNTER — Telehealth: Payer: Self-pay | Admitting: *Deleted

## 2016-04-13 DIAGNOSIS — R531 Weakness: Secondary | ICD-10-CM

## 2016-04-13 NOTE — Telephone Encounter (Signed)
We'll try.  mediare may require an office visit to prove she needs an electric hospital bed, however

## 2016-04-13 NOTE — Telephone Encounter (Signed)
Theresa Hood with [facility] stated that pt services will have to be discontinues, due to pt doing so well. However she will need an order for a Electric bed. Leigh contact [phone]

## 2016-04-13 NOTE — Telephone Encounter (Signed)
Patient scheduled 04/17/16 @ 1130 to discuss.

## 2016-04-13 NOTE — Telephone Encounter (Signed)
Can this order be placed and faxed?

## 2016-04-14 NOTE — Telephone Encounter (Signed)
Left message for leigh to return call to office. Need to know where to send order for Hospital bed.

## 2016-04-17 ENCOUNTER — Ambulatory Visit: Payer: Medicare Other | Admitting: Internal Medicine

## 2016-04-17 DIAGNOSIS — Z0289 Encounter for other administrative examinations: Secondary | ICD-10-CM

## 2016-04-22 NOTE — Telephone Encounter (Signed)
Nurse from hospice has not returned my so called facility [facility] will enquire with RN where to send order .

## 2016-04-23 NOTE — Telephone Encounter (Signed)
Kathy, Please advise?  

## 2016-04-23 NOTE — Telephone Encounter (Signed)
The order can be sent to [facility],fax [phone]

## 2016-04-23 NOTE — Telephone Encounter (Signed)
Order faxed to [facility].

## 2016-04-29 ENCOUNTER — Telehealth: Payer: Self-pay | Admitting: Internal Medicine

## 2016-04-29 NOTE — Telephone Encounter (Signed)
Robin [phone] called from [facility] regarding following up on the consultation report that was faxed over on 05/17. Fax number is [phone]. Thank you!

## 2016-04-29 NOTE — Telephone Encounter (Signed)
Noted. Will send message to Dr. Lupita Dawn nurse, to check status.

## 2016-04-29 NOTE — Telephone Encounter (Signed)
Spoke to [place]. Will r-efax report. Verified fax number.

## 2016-04-29 NOTE — Telephone Encounter (Signed)
What consultation report?

## 2016-05-05 ENCOUNTER — Telehealth: Payer: Self-pay | Admitting: Internal Medicine

## 2016-05-05 DIAGNOSIS — Z794 Long term (current) use of insulin: Principal | ICD-10-CM

## 2016-05-05 DIAGNOSIS — E1122 Type 2 diabetes mellitus with diabetic chronic kidney disease: Secondary | ICD-10-CM

## 2016-05-05 DIAGNOSIS — N182 Chronic kidney disease, stage 2 (mild): Principal | ICD-10-CM

## 2016-05-05 NOTE — Telephone Encounter (Signed)
Lab appointment made.

## 2016-05-05 NOTE — Telephone Encounter (Signed)
Now that Theresa Hood is no longer Hospice, we need to  check CMET and A1c .  Can they do that at [facility] with an order?  I have ordered it  here if not. No fasting required

## 2016-05-08 ENCOUNTER — Other Ambulatory Visit: Payer: Medicare Other

## 2016-05-08 ENCOUNTER — Telehealth: Payer: Self-pay | Admitting: Internal Medicine

## 2016-05-08 NOTE — Telephone Encounter (Signed)
Refaxed paperwork  

## 2016-05-08 NOTE — Telephone Encounter (Addendum)
Linus Orn called back stating the paperwork that was faxed was incorrect. She states she will refax the correct paperwork today. Thank you!

## 2016-05-08 NOTE — Telephone Encounter (Signed)
Theresa Hood called from [facility] regarding certification and plan of treatment form which was faxed on 04/16/16. It needs to be faxed back to fax number [phone]. Thank you!

## 2016-05-11 ENCOUNTER — Ambulatory Visit: Payer: Medicare Other | Admitting: Internal Medicine

## 2016-05-11 NOTE — Telephone Encounter (Addendum)
Tracey [phone] called back from [facility] to follow up on the certification and plan of treatment. Thank you!

## 2016-05-11 NOTE — Telephone Encounter (Signed)
Theresa Hood, did you receive the correct paperwork what was to be faxed back to use received?

## 2016-05-11 NOTE — Telephone Encounter (Signed)
Just received by MD today due to out of the office. In red folder.

## 2016-05-22 NOTE — Telephone Encounter (Signed)
error 

## 2016-07-23 ENCOUNTER — Other Ambulatory Visit: Payer: Self-pay | Admitting: Internal Medicine

## 2016-09-07 ENCOUNTER — Other Ambulatory Visit: Payer: Self-pay | Admitting: Internal Medicine

## 2016-11-20 ENCOUNTER — Other Ambulatory Visit: Payer: Self-pay | Admitting: Internal Medicine

## 2017-02-01 ENCOUNTER — Other Ambulatory Visit: Payer: Self-pay | Admitting: Internal Medicine

## 2017-05-26 ENCOUNTER — Encounter: Payer: Self-pay | Admitting: Emergency Medicine

## 2017-05-26 ENCOUNTER — Emergency Department
Admission: EM | Admit: 2017-05-26 | Discharge: 2017-05-26 | Disposition: A | Discharge status: Skilled Nursing Facility | Payer: Medicare Other | Attending: Emergency Medicine | Admitting: Emergency Medicine

## 2017-05-26 ENCOUNTER — Other Ambulatory Visit: Payer: Self-pay

## 2017-05-26 DIAGNOSIS — F0390 Unspecified dementia without behavioral disturbance: Secondary | ICD-10-CM | POA: Insufficient documentation

## 2017-05-26 DIAGNOSIS — D649 Anemia, unspecified: Secondary | ICD-10-CM | POA: Diagnosis present

## 2017-05-26 DIAGNOSIS — N183 Chronic kidney disease, stage 3 (moderate): Secondary | ICD-10-CM | POA: Insufficient documentation

## 2017-05-26 DIAGNOSIS — N390 Urinary tract infection, site not specified: Secondary | ICD-10-CM | POA: Insufficient documentation

## 2017-05-26 DIAGNOSIS — Z96651 Presence of right artificial knee joint: Secondary | ICD-10-CM | POA: Insufficient documentation

## 2017-05-26 DIAGNOSIS — I129 Hypertensive chronic kidney disease with stage 1 through stage 4 chronic kidney disease, or unspecified chronic kidney disease: Secondary | ICD-10-CM | POA: Insufficient documentation

## 2017-05-26 DIAGNOSIS — E1122 Type 2 diabetes mellitus with diabetic chronic kidney disease: Secondary | ICD-10-CM | POA: Diagnosis not present

## 2017-05-26 DIAGNOSIS — D582 Other hemoglobinopathies: Secondary | ICD-10-CM | POA: Insufficient documentation

## 2017-05-26 DIAGNOSIS — I2510 Atherosclerotic heart disease of native coronary artery without angina pectoris: Secondary | ICD-10-CM | POA: Insufficient documentation

## 2017-05-26 LAB — URINALYSIS, COMPLETE (UACMP) WITH MICROSCOPIC
Bacteria, UA: NONE SEEN
Bilirubin Urine: NEGATIVE
Glucose, UA: NEGATIVE mg/dL
KETONES UR: NEGATIVE mg/dL
Nitrite: POSITIVE — AB
PH: 7.5 (ref 5.0–8.0)
PROTEIN: 100 mg/dL — AB
Specific Gravity, Urine: 1.02 (ref 1.005–1.030)

## 2017-05-26 LAB — CBC WITH DIFFERENTIAL/PLATELET
BASOS ABS: 0.1 10*3/uL (ref 0–0.1)
Basophils Relative: 2 %
Eosinophils Absolute: 0.2 10*3/uL (ref 0–0.7)
Eosinophils Relative: 3 %
HEMATOCRIT: 25.6 % — AB (ref 35.0–47.0)
HEMOGLOBIN: 7.9 g/dL — AB (ref 12.0–16.0)
LYMPHS PCT: 21 %
Lymphs Abs: 1.4 10*3/uL (ref 1.0–3.6)
MCH: 20.7 pg — ABNORMAL LOW (ref 26.0–34.0)
MCHC: 30.9 g/dL — ABNORMAL LOW (ref 32.0–36.0)
MCV: 66.9 fL — AB (ref 80.0–100.0)
Monocytes Absolute: 0.8 10*3/uL (ref 0.2–0.9)
Monocytes Relative: 12 %
NEUTROS ABS: 4.1 10*3/uL (ref 1.4–6.5)
NEUTROS PCT: 62 %
PLATELETS: 227 10*3/uL (ref 150–440)
RBC: 3.82 MIL/uL (ref 3.80–5.20)
RDW: 19.8 % — ABNORMAL HIGH (ref 11.5–14.5)
WBC: 6.5 10*3/uL (ref 3.6–11.0)

## 2017-05-26 LAB — COMPREHENSIVE METABOLIC PANEL
ALT: 11 U/L — AB (ref 14–54)
AST: 28 U/L (ref 15–41)
Albumin: 3.9 g/dL (ref 3.5–5.0)
Alkaline Phosphatase: 69 U/L (ref 38–126)
Anion gap: 7 (ref 5–15)
BUN: 32 mg/dL — AB (ref 6–20)
CHLORIDE: 107 mmol/L (ref 101–111)
CO2: 19 mmol/L — ABNORMAL LOW (ref 22–32)
CREATININE: 1.31 mg/dL — AB (ref 0.44–1.00)
Calcium: 8.7 mg/dL — ABNORMAL LOW (ref 8.9–10.3)
GFR calc Af Amer: 41 mL/min — ABNORMAL LOW (ref >60)
GFR, EST NON AFRICAN AMERICAN: 35 mL/min — AB (ref >60)
Glucose, Bld: 183 mg/dL — ABNORMAL HIGH (ref 65–99)
Potassium: 4.2 mmol/L (ref 3.5–5.1)
Sodium: 133 mmol/L — ABNORMAL LOW (ref 135–145)
Total Bilirubin: 0.5 mg/dL (ref 0.3–1.2)
Total Protein: 7.3 g/dL (ref 6.5–8.1)

## 2017-05-26 LAB — TYPE AND SCREEN
ABO/RH(D): O POS
ANTIBODY SCREEN: NEGATIVE

## 2017-05-26 MED ORDER — CEPHALEXIN 250 MG PO CAPS: 250.0000 mg | ORAL_CAPSULE | Freq: Four times a day (QID) | ORAL | 0 refills | Status: AC

## 2017-05-26 MED ORDER — DEXTROSE 5 % IV SOLN
1.0000 g | Freq: Once | INTRAVENOUS | Status: AC
  Administered 2017-05-26: 1 g via INTRAVENOUS
  Filled 2017-05-26: qty 10

## 2017-05-26 NOTE — ED Triage Notes (Signed)
Pt in via EMS from [facility], per EMS, pt with abnormal lab work, Hgb 6.1.  Pt with no complaints at this time, denies any signs of obvious bleeding.  Pt A/Ox3, vitals WDL.  NAD noted at this time.

## 2017-05-26 NOTE — ED Provider Notes (Signed)
[facility] Emergency Department Provider Note       Time seen: ----------------------------------------- 11:33 AM on 05/26/2017 -----------------------------------------  Level V caveat: History/ROS limited by altered mental status   I have reviewed the triage vital signs and the nursing notes.   HISTORY   Chief Complaint Abnormal Lab    HPI Theresa Hood is a 83 y.o. female who presents to the ED for anemia. Patient denies complaints, reportedly had outpatient labs done that revealed anemia. She does have a history of anemia but her hemoglobin was reportedly 6.8. It is unclear how long she has been anemic.   Past Medical History:  Diagnosis Date  . Coronary artery disease   . Dementia arising in the senium and presenium   . Depression, major, in remission ([place])   . Diabetes mellitus   . Hyperlipidemia   . Hypertension   . Melanoma, choroid, left eye [facility])    s/p enucleation    Patient Active Problem List   Diagnosis Date Noted  . Diarrhea 12/23/2015  . CKD (chronic kidney disease), stage III 12/23/2015  . Acute cystitis with hematuria 12/23/2015  . Escherichia coli infection 12/23/2015  . Diabetes mellitus type 2 in obese ([place]) 12/23/2015  . Generalized weakness 12/23/2015  . Encephalopathy, metabolic 32/99/2426  . Anemia, iron deficiency 12/18/2015  . Left knee pain 07/12/2015  . Medicare annual wellness visit, subsequent 03/24/2015  . Hemorrhoid 04/02/2014  . Essential hypertension 03/28/2014  . Hemorrhoid prolapse 02/12/2014  . UTI (urinary tract infection) 02/09/2014  . Vaginitis, atrophic 09/20/2013  . Urinary frequency 09/25/2012  . Type 2 diabetes mellitus with stage 3 chronic kidney disease ([place])   . Hyperlipidemia   . Coronary artery disease   . Depression, major, in remission ([place])   . Dementia arising in the senium and presenium     Past Surgical History:  Procedure Laterality Date  . CHOLECYSTECTOMY    . JOINT  REPLACEMENT     total right knee    Allergies Vicodin [hydrocodone-acetaminophen]  Social History Social History  Substance Use Topics  . Smoking status: Never Smoker  . Smokeless tobacco: Never Used  . Alcohol use No    Review of Systems Unknown, positive for reported anemia  All systems negative/normal/unremarkable except as stated in the HPI  ____________________________________________   PHYSICAL EXAM:  VITAL SIGNS: ED Triage Vitals  Enc Vitals Group     BP      Pulse      Resp      Temp      Temp src      SpO2      Weight      Height      Head Circumference      Peak Flow      Pain Score      Pain Loc      Pain Edu?      Excl. in [place]?     Constitutional: Alert but disoriented Well appearing and in no distress. Eyes: Conjunctivae are pale Normal extraocular movements. ENT   Head: Normocephalic and atraumatic.   Nose: No congestion/rhinnorhea.   Mouth/Throat: Mucous membranes are moist.   Neck: No stridor. Cardiovascular: Normal rate, regular rhythm. No murmurs, rubs, or gallops. Respiratory: Normal respiratory effort without tachypnea nor retractions. Breath sounds are clear and equal bilaterally. No wheezes/rales/rhonchi. Gastrointestinal: Soft and nontender. Normal bowel sounds Musculoskeletal: Nontender with normal range of motion in extremities. No lower extremity tenderness nor edema. Rectal: No gross blood, heme negative  Neurologic:  Normal speech and language. No gross focal neurologic deficits are appreciated.  Skin:  Skin is warm, dry and intact. Pallor is noted Psychiatric: Mood and affect are normal. Speech and behavior are normal.  ____________________________________________  EKG: Interpreted by me. Sinus rhythm with rate 81 bpm, normal. We'll, normal QRS, normal QT.  ____________________________________________  ED COURSE:  Pertinent labs & imaging results that were available during my care of the patient were reviewed by  me and considered in my medical decision making (see chart for details). Patient presents for anemia, we will assess with labs and imaging as indicated.   Procedures ____________________________________________   LABS (pertinent positives/negatives)  Labs Reviewed  CBC WITH DIFFERENTIAL/PLATELET - Abnormal; Notable for the following:       Result Value   Hemoglobin 7.9 (*)    HCT 25.6 (*)    MCV 66.9 (*)    MCH 20.7 (*)    MCHC 30.9 (*)    RDW 19.8 (*)    All other components within normal limits  COMPREHENSIVE METABOLIC PANEL - Abnormal; Notable for the following:    Sodium 133 (*)    CO2 19 (*)    Glucose, Bld 183 (*)    BUN 32 (*)    Creatinine, Ser 1.31 (*)    Calcium 8.7 (*)    ALT 11 (*)    GFR calc non Af Amer 35 (*)    GFR calc Af Amer 41 (*)    All other components within normal limits  URINALYSIS, COMPLETE (UACMP) WITH MICROSCOPIC - Abnormal; Notable for the following:    APPearance TURBID (*)    Hgb urine dipstick MODERATE (*)    Protein, ur 100 (*)    Nitrite POSITIVE (*)    Leukocytes, UA LARGE (*)    Squamous Epithelial / LPF 0-5 (*)    All other components within normal limits  TYPE AND SCREEN   ____________________________________________  FINAL ASSESSMENT AND PLAN  Anemia, dementia, UTI  Plan: Patient's labs and imaging were dictated above. Patient had presented for concerns for anemia from labs done as an outpatient. She is anemic but does not require blood transfusion at this time. We have given IV antibiotics for UTI. She will require repeat of her hemoglobin is now outpatient. She is stable for outpatient follow-up.   Earleen Newport, MD   Note: This note was generated in part or whole with voice recognition software. Voice recognition is usually quite accurate but there are transcription errors that can and very often do occur. I apologize for any typographical errors that were not detected and corrected.     Earleen Newport,  MD 05/26/17 [phone]

## 2017-05-26 NOTE — ED Notes (Signed)
EMS arrived to transport pt.

## 2017-05-26 NOTE — ED Notes (Signed)
Pt lying peacefully in bed, resp even and unlabored.

## 2017-06-03 LAB — URINE CULTURE: SPECIAL REQUESTS: NORMAL

## 2017-09-01 NOTE — Progress Notes (Signed)
Error

## 2017-09-02 NOTE — Telephone Encounter (Signed)
Error

## 2017-09-02 NOTE — Telephone Encounter (Signed)
Orders

## 2017-09-06 NOTE — Telephone Encounter (Signed)
orders

## 2017-09-06 NOTE — Telephone Encounter (Signed)
Error

## 2017-10-11 ENCOUNTER — Emergency Department: Payer: Medicare Other

## 2017-10-11 ENCOUNTER — Inpatient Hospital Stay
Admission: EM | Admit: 2017-10-11 | Discharge: 2017-10-16 | DRG: 871 | Disposition: A | Discharge status: Hospice | Payer: Medicare Other | Attending: Internal Medicine | Admitting: Internal Medicine

## 2017-10-11 ENCOUNTER — Other Ambulatory Visit: Payer: Self-pay

## 2017-10-11 DIAGNOSIS — J9601 Acute respiratory failure with hypoxia: Secondary | ICD-10-CM | POA: Diagnosis present

## 2017-10-11 DIAGNOSIS — N170 Acute kidney failure with tubular necrosis: Secondary | ICD-10-CM | POA: Diagnosis present

## 2017-10-11 DIAGNOSIS — R6520 Severe sepsis without septic shock: Secondary | ICD-10-CM

## 2017-10-11 DIAGNOSIS — E785 Hyperlipidemia, unspecified: Secondary | ICD-10-CM | POA: Diagnosis present

## 2017-10-11 DIAGNOSIS — I248 Other forms of acute ischemic heart disease: Secondary | ICD-10-CM | POA: Diagnosis present

## 2017-10-11 DIAGNOSIS — Z515 Encounter for palliative care: Secondary | ICD-10-CM | POA: Diagnosis present

## 2017-10-11 DIAGNOSIS — E669 Obesity, unspecified: Secondary | ICD-10-CM

## 2017-10-11 DIAGNOSIS — N179 Acute kidney failure, unspecified: Secondary | ICD-10-CM | POA: Diagnosis present

## 2017-10-11 DIAGNOSIS — F0390 Unspecified dementia without behavioral disturbance: Secondary | ICD-10-CM | POA: Diagnosis present

## 2017-10-11 DIAGNOSIS — Z66 Do not resuscitate: Secondary | ICD-10-CM | POA: Diagnosis present

## 2017-10-11 DIAGNOSIS — E861 Hypovolemia: Secondary | ICD-10-CM | POA: Diagnosis present

## 2017-10-11 DIAGNOSIS — K7290 Hepatic failure, unspecified without coma: Secondary | ICD-10-CM

## 2017-10-11 DIAGNOSIS — N189 Chronic kidney disease, unspecified: Secondary | ICD-10-CM

## 2017-10-11 DIAGNOSIS — F329 Major depressive disorder, single episode, unspecified: Secondary | ICD-10-CM | POA: Diagnosis present

## 2017-10-11 DIAGNOSIS — E1122 Type 2 diabetes mellitus with diabetic chronic kidney disease: Secondary | ICD-10-CM | POA: Diagnosis present

## 2017-10-11 DIAGNOSIS — D65 Disseminated intravascular coagulation [defibrination syndrome]: Secondary | ICD-10-CM | POA: Diagnosis present

## 2017-10-11 DIAGNOSIS — IMO0002 Reserved for concepts with insufficient information to code with codable children: Secondary | ICD-10-CM

## 2017-10-11 DIAGNOSIS — N184 Chronic kidney disease, stage 4 (severe): Secondary | ICD-10-CM | POA: Diagnosis present

## 2017-10-11 DIAGNOSIS — A419 Sepsis, unspecified organism: Principal | ICD-10-CM | POA: Diagnosis present

## 2017-10-11 DIAGNOSIS — E1169 Type 2 diabetes mellitus with other specified complication: Secondary | ICD-10-CM | POA: Diagnosis present

## 2017-10-11 DIAGNOSIS — I2510 Atherosclerotic heart disease of native coronary artery without angina pectoris: Secondary | ICD-10-CM | POA: Diagnosis present

## 2017-10-11 DIAGNOSIS — R778 Other specified abnormalities of plasma proteins: Secondary | ICD-10-CM

## 2017-10-11 DIAGNOSIS — Z794 Long term (current) use of insulin: Secondary | ICD-10-CM | POA: Diagnosis not present

## 2017-10-11 DIAGNOSIS — Z96651 Presence of right artificial knee joint: Secondary | ICD-10-CM | POA: Diagnosis present

## 2017-10-11 DIAGNOSIS — K7200 Acute and subacute hepatic failure without coma: Secondary | ICD-10-CM | POA: Diagnosis present

## 2017-10-11 DIAGNOSIS — I129 Hypertensive chronic kidney disease with stage 1 through stage 4 chronic kidney disease, or unspecified chronic kidney disease: Secondary | ICD-10-CM | POA: Diagnosis present

## 2017-10-11 DIAGNOSIS — J189 Pneumonia, unspecified organism: Secondary | ICD-10-CM | POA: Diagnosis present

## 2017-10-11 DIAGNOSIS — Z85820 Personal history of malignant melanoma of skin: Secondary | ICD-10-CM

## 2017-10-11 DIAGNOSIS — R6521 Severe sepsis with septic shock: Secondary | ICD-10-CM | POA: Diagnosis present

## 2017-10-11 DIAGNOSIS — N3001 Acute cystitis with hematuria: Secondary | ICD-10-CM | POA: Diagnosis present

## 2017-10-11 DIAGNOSIS — I10 Essential (primary) hypertension: Secondary | ICD-10-CM | POA: Diagnosis present

## 2017-10-11 DIAGNOSIS — R7989 Other specified abnormal findings of blood chemistry: Secondary | ICD-10-CM

## 2017-10-11 LAB — COMPREHENSIVE METABOLIC PANEL
ALBUMIN: 3.2 g/dL — AB (ref 3.5–5.0)
ALT: 890 U/L — ABNORMAL HIGH (ref 14–54)
ANION GAP: 12 (ref 5–15)
AST: 528 U/L — ABNORMAL HIGH (ref 15–41)
Alkaline Phosphatase: 193 U/L — ABNORMAL HIGH (ref 38–126)
BUN: 88 mg/dL — ABNORMAL HIGH (ref 6–20)
CALCIUM: 8.8 mg/dL — AB (ref 8.9–10.3)
CHLORIDE: 119 mmol/L — AB (ref 101–111)
CO2: 17 mmol/L — AB (ref 22–32)
Creatinine, Ser: 2.23 mg/dL — ABNORMAL HIGH (ref 0.44–1.00)
GFR calc non Af Amer: 18 mL/min — ABNORMAL LOW (ref >60)
GFR, EST AFRICAN AMERICAN: 21 mL/min — AB (ref >60)
GLUCOSE: 173 mg/dL — AB (ref 65–99)
POTASSIUM: 4.1 mmol/L (ref 3.5–5.1)
SODIUM: 148 mmol/L — AB (ref 135–145)
Total Bilirubin: 2.1 mg/dL — ABNORMAL HIGH (ref 0.3–1.2)
Total Protein: 6.2 g/dL — ABNORMAL LOW (ref 6.5–8.1)

## 2017-10-11 LAB — GLUCOSE, CAPILLARY: Glucose-Capillary: 188 mg/dL — ABNORMAL HIGH (ref 65–99)

## 2017-10-11 LAB — URINALYSIS, ROUTINE W REFLEX MICROSCOPIC
BILIRUBIN URINE: NEGATIVE
GLUCOSE, UA: NEGATIVE mg/dL
Ketones, ur: NEGATIVE mg/dL
Nitrite: NEGATIVE
Protein, ur: NEGATIVE mg/dL
SPECIFIC GRAVITY, URINE: 1.016 (ref 1.005–1.030)
pH: 5 (ref 5.0–8.0)

## 2017-10-11 LAB — FIBRIN DERIVATIVES D-DIMER (ARMC ONLY): FIBRIN DERIVATIVES D-DIMER (ARMC): 7383.87 ng{FEU}/mL — AB (ref 0.00–499.00)

## 2017-10-11 LAB — CBC WITH DIFFERENTIAL/PLATELET
BASOS ABS: 0 10*3/uL (ref 0–0.1)
BLASTS: 0 %
Band Neutrophils: 0 %
Basophils Relative: 0 %
EOS PCT: 0 %
Eosinophils Absolute: 0 10*3/uL (ref 0–0.7)
HEMATOCRIT: 22 % — AB (ref 35.0–47.0)
Hemoglobin: 5.9 g/dL — ABNORMAL LOW (ref 12.0–16.0)
LYMPHS ABS: 1.9 10*3/uL (ref 1.0–3.6)
LYMPHS PCT: 18 %
MCH: 15.7 pg — AB (ref 26.0–34.0)
MCHC: 27 g/dL — ABNORMAL LOW (ref 32.0–36.0)
MCV: 58.3 fL — ABNORMAL LOW (ref 80.0–100.0)
MONOS PCT: 3 %
Metamyelocytes Relative: 0 %
Monocytes Absolute: 0.3 10*3/uL (ref 0.2–0.9)
Myelocytes: 0 %
NEUTROS ABS: 8.3 10*3/uL — AB (ref 1.4–6.5)
NEUTROS PCT: 79 %
NRBC: 10 /100{WBCs} — AB
Other: 0 %
PLATELETS: 39 10*3/uL — AB (ref 150–440)
Promyelocytes Absolute: 0 %
RBC: 3.77 MIL/uL — AB (ref 3.80–5.20)
RDW: 23.2 % — AB (ref 11.5–14.5)
WBC: 10.5 10*3/uL (ref 3.6–11.0)

## 2017-10-11 LAB — LACTIC ACID, PLASMA: LACTIC ACID, VENOUS: 2.7 mmol/L — AB (ref 0.5–1.9)

## 2017-10-11 LAB — BRAIN NATRIURETIC PEPTIDE: B NATRIURETIC PEPTIDE 5: 1123 pg/mL — AB (ref 0.0–100.0)

## 2017-10-11 LAB — PROTIME-INR
INR: 3.14
Prothrombin Time: 32 seconds — ABNORMAL HIGH (ref 11.4–15.2)

## 2017-10-11 LAB — PROCALCITONIN: Procalcitonin: 0.33 ng/mL

## 2017-10-11 LAB — LACTATE DEHYDROGENASE: LDH: 554 U/L — ABNORMAL HIGH (ref 98–192)

## 2017-10-11 LAB — PREPARE RBC (CROSSMATCH)

## 2017-10-11 LAB — FIBRINOGEN

## 2017-10-11 LAB — LIPASE, BLOOD: Lipase: 25 U/L (ref 11–51)

## 2017-10-11 LAB — TROPONIN I: TROPONIN I: 0.08 ng/mL — AB (ref <0.03)

## 2017-10-11 MED ORDER — SODIUM CHLORIDE 0.9 % IV SOLN
INTRAVENOUS | Status: DC
  Administered 2017-10-12: via INTRAVENOUS

## 2017-10-11 MED ORDER — ONDANSETRON HCL 4 MG/2ML IJ SOLN: 4.0000 mg | Freq: Four times a day (QID) | INTRAMUSCULAR | Status: DC | PRN

## 2017-10-11 MED ORDER — PIPERACILLIN-TAZOBACTAM 3.375 G IVPB 30 MIN
3.3750 g | Freq: Once | INTRAVENOUS | Status: AC
  Administered 2017-10-11: 3.375 g via INTRAVENOUS
  Filled 2017-10-11: qty 50

## 2017-10-11 MED ORDER — SODIUM CHLORIDE 0.9 % IV SOLN
Freq: Once | INTRAVENOUS | Status: AC
  Administered 2017-10-12: 01:00:00 via INTRAVENOUS

## 2017-10-11 MED ORDER — DIPHENHYDRAMINE HCL 50 MG/ML IJ SOLN
25.0000 mg | Freq: Once | INTRAMUSCULAR | Status: AC
  Administered 2017-10-12: 25 mg via INTRAVENOUS
  Filled 2017-10-11: qty 1

## 2017-10-11 MED ORDER — ONDANSETRON HCL 4 MG PO TABS: 4.0000 mg | ORAL_TABLET | Freq: Four times a day (QID) | ORAL | Status: DC | PRN

## 2017-10-11 MED ORDER — SODIUM CHLORIDE 0.9 % IV BOLUS (SEPSIS)
1000.0000 mL | Freq: Once | INTRAVENOUS | Status: AC
  Administered 2017-10-11: 1000 mL via INTRAVENOUS

## 2017-10-11 MED ORDER — VANCOMYCIN HCL IN DEXTROSE 1-5 GM/200ML-% IV SOLN
1000.0000 mg | Freq: Once | INTRAVENOUS | Status: AC
  Administered 2017-10-11: 1000 mg via INTRAVENOUS
  Filled 2017-10-11: qty 200

## 2017-10-11 MED ORDER — INSULIN ASPART 100 UNIT/ML ~~LOC~~ SOLN
0.0000 [IU] | Freq: Four times a day (QID) | SUBCUTANEOUS | Status: DC
  Administered 2017-10-11: 2 [IU] via SUBCUTANEOUS
  Administered 2017-10-12: 1 [IU] via SUBCUTANEOUS
  Administered 2017-10-12: 2 [IU] via SUBCUTANEOUS
  Filled 2017-10-11 (×2): qty 1

## 2017-10-11 NOTE — ED Provider Notes (Signed)
[facility] Emergency Department Provider Note  ____________________________________________   First MD Initiated Contact with Patient 10/11/17 2005     (approximate)  I have reviewed the triage vital signs and the nursing notes.   HISTORY  Chief Complaint Abnormal Lab  Level 5 exemption history limited by the patient's dementia  HPI Theresa Hood is a 83 y.o. female who comes to the emergency department via EMS for reportedly "abnormal labs".  According to EMS the patient is normally with significant dementia and nonverbal.  She is full code.  Paperwork for comes of the patient reports "elevated BUN and creatinine".  Apparently the patient has not eaten in several days.  Past Medical History:  Diagnosis Date  . Coronary artery disease   . Dementia arising in the senium and presenium   . Depression, major, in remission ([place])   . Diabetes mellitus   . Hyperlipidemia   . Hypertension   . Melanoma, choroid, left eye [facility])    s/p enucleation    Patient Active Problem List   Diagnosis Date Noted  . Acute on chronic kidney failure ([place]) 10/11/2017  . Sepsis ([place]) 10/11/2017  . Acute liver failure 10/11/2017  . DIC (disseminated intravascular coagulation) ([place]) 10/11/2017  . Diarrhea 12/23/2015  . CKD (chronic kidney disease), stage III ([place]) 12/23/2015  . Acute cystitis with hematuria 12/23/2015  . Escherichia coli infection 12/23/2015  . Diabetes mellitus type 2 in obese ([place]) 12/23/2015  . Generalized weakness 12/23/2015  . Encephalopathy, metabolic 52/84/1324  . Anemia, iron deficiency 12/18/2015  . Left knee pain 07/12/2015  . Medicare annual wellness visit, subsequent 03/24/2015  . Hemorrhoid 04/02/2014  . Essential hypertension 03/28/2014  . Hemorrhoid prolapse 02/12/2014  . Vaginitis, atrophic 09/20/2013  . Urinary frequency 09/25/2012  . Type 2 diabetes mellitus with stage 3 chronic kidney disease ([place])   . Hyperlipidemia   . Coronary  artery disease   . Depression, major, in remission ([place])   . Dementia arising in the senium and presenium     Past Surgical History:  Procedure Laterality Date  . CHOLECYSTECTOMY    . JOINT REPLACEMENT     total right knee    Prior to Admission medications   Medication Sig Start Date End Date Taking? Authorizing Provider  acetaminophen (TYLENOL) 500 MG tablet Take 500 mg by mouth every 8 (eight) hours as needed for moderate pain.   Yes [provider]  amoxicillin-clavulanate (AUGMENTIN) 875-125 MG tablet Take 1 tablet 2 (two) times daily by mouth. 10/10/17 10/20/17 Yes [provider]  benzonatate (TESSALON) 200 MG capsule Take 200 mg by mouth 3 (three) times daily as needed for cough.   Yes [provider]  donepezil (ARICEPT) 10 MG tablet Take 10 mg by mouth at bedtime.   Yes [provider]  ergocalciferol (DRISDOL) 50000 UNITS capsule Take 1 capsule (50,000 Units total) by mouth once a week. Patient taking differently: Take 50,000 Units by mouth every Monday.  03/26/15  Yes Crecencio Mc, MD  guaifenesin (ROBITUSSIN) 100 MG/5ML syrup Take 200 mg by mouth every 8 (eight) hours as needed for congestion.   Yes [provider]  hydrocortisone (ANUSOL-HC) 2.5 % rectal cream Place 1 application rectally 2 (two) times daily. Patient taking differently: Place 1 application rectally every 12 (twelve) hours as needed for hemorrhoids.  02/12/14  Yes Crecencio Mc, MD  insulin NPH-regular Human (NOVOLIN 70/30) (70-30) 100 UNIT/ML injection 10 UNITS BEFORE BREAKFAST,  INCREASE TO 15 FOR bx >  250 And 18 units before dinner  Hold either  dose for BS <  130 Patient taking differently: Inject 12 Units daily with breakfast into the skin.  06/24/15  Yes Crecencio Mc, MD  insulin regular (NOVOLIN R,HUMULIN R) 100 units/mL injection Inject 20 Units every evening into the skin. With dinner  Also per sliding scale 151-200=3 units 201-250=6  units 251-300=10 units 301-350=14 units 351-400=16 units   Yes [provider]  ipratropium-albuterol (DUONEB) 0.5-2.5 (3) MG/3ML SOLN Take 3 mLs every 2 (two) hours as needed by nebulization (for congestion).   Yes [provider]  loperamide (IMODIUM) 2 MG capsule Take 2 capsules (4 mg total) by mouth as needed for diarrhea or loose stools (max 6 tablets daily). Patient taking differently: Take 2 mg by mouth every 8 (eight) hours as needed for diarrhea or loose stools.  05/03/12  Yes Crecencio Mc, MD  mirtazapine (REMERON) 30 MG tablet Take 1 tablet (30 mg total) by mouth at bedtime. 08/30/15  Yes Crecencio Mc, MD  topiramate (TOPAMAX) 25 MG tablet Take 25 mg by mouth at bedtime.    Yes [provider]  insulin aspart (NOVOLOG) 100 UNIT/ML injection Give before lunch sliding scale: CBG  151 to 200: Give 3 units; CBG 201 to 250:  Give  6 units; CBG 251 to 300  Give 10 units, CBG 301 to 350   Give 14 units Patient not taking: Reported on 10/11/2017 07/02/15   Crecencio Mc, MD  magnesium hydroxide (MILK OF MAGNESIA) 400 MG/5ML suspension Take 30 mLs by mouth daily as needed for mild constipation or moderate constipation.     [provider]    Allergies Patient has no known allergies.  Family History  Problem Relation Age of Onset  . Diabetes Mother   . Heart disease Father     Social History Social History   Tobacco Use  . Smoking status: Never Smoker  . Smokeless tobacco: Never Used  Substance Use Topics  . Alcohol use: No  . Drug use: No    Review of Systems Level 5 exemption history limited by the patient's dementia  ____________________________________________   PHYSICAL EXAM:  VITAL SIGNS: ED Triage Vitals  Enc Vitals Group     BP      Pulse      Resp      Temp      Temp src      SpO2      Weight      Height      Head Circumference      Peak Flow      Pain Score      Pain Loc      Pain Edu?      Excl. in [place]?      Constitutional: Elevated respiratory rate.  Nonverbal.  No respiratory distress.  Interned in contorted onto her right side. Eyes: PERRL EOMI. Head: Atraumatic.  Very poor dentition Nose: No congestion/rhinnorhea. Mouth/Throat: No trismus Neck: No stridor.   Cardiovascular: Tachycardic rate, regular rhythm. Grossly normal heart sounds.  Good peripheral circulation. Respiratory: Slightly increased respiratory effort.  No retractions. Lungs CTAB and moving good air Gastrointestinal: Soft nontender Musculoskeletal: 3+ pitting edema to knees bilaterally legs equal in size Neurologic: Contracted to the right although able to move all 4 extremities Skin:  Skin is warm, dry and intact. No rash noted. Psychiatric: Severe dementia    ____________________________________________   DIFFERENTIAL includes but not limited to  Sepsis, pneumonia,  aspiration pneumonia, urinary tract infection, dehydration ____________________________________________   LABS (all labs ordered are listed, but only abnormal results are displayed)  Labs Reviewed  LACTIC ACID, PLASMA - Abnormal; Notable for the following components:      Result Value   Lactic Acid, Venous 2.7 (*)    All other components within normal limits  COMPREHENSIVE METABOLIC PANEL - Abnormal; Notable for the following components:   Sodium 148 (*)    Chloride 119 (*)    CO2 17 (*)    Glucose, Bld 173 (*)    BUN 88 (*)    Creatinine, Ser 2.23 (*)    Calcium 8.8 (*)    Total Protein 6.2 (*)    Albumin 3.2 (*)    AST 528 (*)    ALT 890 (*)    Alkaline Phosphatase 193 (*)    Total Bilirubin 2.1 (*)    GFR calc non Af Amer 18 (*)    GFR calc Af Amer 21 (*)    All other components within normal limits  TROPONIN I - Abnormal; Notable for the following components:   Troponin I 0.08 (*)    All other components within normal limits  CBC WITH DIFFERENTIAL/PLATELET - Abnormal; Notable for the following components:   RBC 3.77 (*)     Hemoglobin 5.9 (*)    HCT 22.0 (*)    MCV 58.3 (*)    MCH 15.7 (*)    MCHC 27.0 (*)    RDW 23.2 (*)    Platelets 39 (*)    nRBC 10 (*)    Neutro Abs 8.3 (*)    All other components within normal limits  PROTIME-INR - Abnormal; Notable for the following components:   Prothrombin Time 32.0 (*)    All other components within normal limits  URINALYSIS, ROUTINE W REFLEX MICROSCOPIC - Abnormal; Notable for the following components:   Color, Urine YELLOW (*)    APPearance CLOUDY (*)    Hgb urine dipstick MODERATE (*)    Leukocytes, UA LARGE (*)    Bacteria, UA RARE (*)    Squamous Epithelial / LPF 0-5 (*)    All other components within normal limits  BRAIN NATRIURETIC PEPTIDE - Abnormal; Notable for the following components:   B Natriuretic Peptide 1,123.0 (*)    All other components within normal limits  LACTATE DEHYDROGENASE - Abnormal; Notable for the following components:   LDH 554 (*)    All other components within normal limits  FIBRIN DERIVATIVES D-DIMER ([facility] ONLY) - Abnormal; Notable for the following components:   Fibrin derivatives D-dimer [facility]) 6,160.73 (*)    All other components within normal limits  CULTURE, BLOOD (ROUTINE X 2)  CULTURE, BLOOD (ROUTINE X 2)  URINE CULTURE  LIPASE, BLOOD  PROCALCITONIN  LACTIC ACID, PLASMA  PATHOLOGIST SMEAR REVIEW  HEPATITIS PANEL, ACUTE  HAPTOGLOBIN  FIBRINOGEN  TYPE AND SCREEN  PREPARE RBC (CROSSMATCH)  PREPARE FRESH FROZEN PLASMA    Blood work reviewed and interpreted by me with a number of abnormalities.  Patient is in renal failure as well as acute liver failure.  Low hemoglobin and low platelets are concerning for either severe sepsis or TTP.  ______________________________  EKG ED ECG REPORT I, Darel Hong, the attending physician, personally viewed and interpreted this ECG.  Date: 10/11/2017 EKG Time:  Rate: 129 Rhythm: Sinus tachycardia QRS Axis: normal Intervals: normal ST/T Wave abnormalities:  normal Narrative Interpretation: no evidence of acute ischemia  ____________________________________________  RADIOLOGY  Chest x-ray reviewed  by me shows no acute disease ____________________________________________   PROCEDURES  Procedure(s) performed: no  Procedures  Critical Care performed: yes  CRITICAL CARE Performed by: Darel Hong   Total critical care time: 45 minutes  Critical care time was exclusive of separately billable procedures and treating other patients.  Critical care was necessary to treat or prevent imminent or life-threatening deterioration.  Critical care was time spent personally by me on the following activities: development of treatment plan with patient and/or surrogate as well as nursing, discussions with consultants, evaluation of patient's response to treatment, examination of patient, obtaining history from patient or surrogate, ordering and performing treatments and interventions, ordering and review of laboratory studies, ordering and review of radiographic studies, pulse oximetry and re-evaluation of patient's condition.   Observation: no ____________________________________________   INITIAL IMPRESSION / ASSESSMENT AND PLAN / ED COURSE  Pertinent labs & imaging results that were available during my care of the patient were reviewed by me and considered in my medical decision making (see chart for details).  History is limited as the patient is nonverbal.  Per EMS she was hypotensive to 80 tachycardic to 120 and saturating 80%.  On review of the paperwork from her nursing home she apparently has a left lower lobe pneumonia and is currently taking Augmentin.  My concern is progression of her pneumonia and some component of aspiration.  Zosyn and vancomycin as well as fluid resuscitation.  She will require inpatient admission given her sepsis.     ----------------------------------------- 9:28 PM on  10/11/2017 -----------------------------------------  I had a lengthy discussion with the patient's son and daughter-in-law at bedside and his daughter-in-law is actually a Marine scientist at [employer].  While the patient arrived with no paperwork she is DO NOT RESUSCITATE/DO NOT INTUBATE. ____________________________________________  The patient's labs are concerning for either TTP versus septic shock.  I had a lengthy discussion with the family at bedside and they would prefer not to be transferred and do not want plasmapheresis.  The patient will remain admitted to our hospital for aggressive fluid resuscitation and broad-spectrum antibiotics.  I discussed the case with the hospitalist Dr. Jannifer Franklin who is graciously agreed to admit the patient to his service.  FINAL CLINICAL IMPRESSION(S) / ED DIAGNOSES  Final diagnoses:  Sepsis, due to unspecified organism ([place])  Elevated troponin  Multi-organ failure with liver failure ([place])      NEW MEDICATIONS STARTED DURING THIS VISIT:  This SmartLink is deprecated. Use AVSMEDLIST instead to display the medication list for a patient.   Note:  This document was prepared using Dragon voice recognition software and may include unintentional dictation errors.     Darel Hong, MD 10/11/17 2314

## 2017-10-11 NOTE — H&P (Signed)
[facility] [place] NAME: Theresa Hood    MR#:  [id number]  DATE OF BIRTH:  December 13, 1924  DATE OF ADMISSION:  10/11/2017  PRIMARY CARE PHYSICIAN: Crecencio Mc, MD   REQUESTING/REFERRING PHYSICIAN: Mable Paris, MD  CHIEF COMPLAINT:   Chief Complaint  Patient presents with  . Abnormal Lab    HISTORY OF PRESENT ILLNESS:  Theresa Hood  is a 83 y.o. female who presents with angina at baseline who comes to the ED today due to abnormal labs.  She had BUN and creatinine checked and they were elevated, so she was sent to the ED for further evaluation.  She has significant the altered, was found initially to be hypothermic and blood pressure borderline hypotensive.  Initial lab evaluation in the ED showed she was coagulopathic with an INR of 3 not on blood thinners, was then found to have acute liver failure, anemia, severe thrombocytopenia, with urine highly suspicious for infection.  She was found subsequently to be in DIC with schistocytes on her blood smear, fibrinogen of 7000, elevated LDH.  Intensive conversation held with family about patient's poor prognosis, about the possibility of TTP versus sepsis multiorgan failure and DIC.  Discussed with family that if the patient has TTP we cannot perform plasmapheresis at this facility.  Family did not want to initiate comfort measures at this time, and understands that if she does have TTP this may be a fatal illness for her, they also understand that her sepsis with multiorgan failure and DIC and also is very possibly fatal for this patient at this time.  They wanted the patient to be treated here, and not transferred, made her DNR, but do want to pursue treatment upfront.  Hospitalist were called for admission  PAST MEDICAL HISTORY:   Past Medical History:  Diagnosis Date  . Coronary artery disease   . Dementia arising in the senium and presenium   . Depression, major, in remission ([facility])   .  Diabetes mellitus   . Hyperlipidemia   . Hypertension   . Melanoma, choroid, left eye ([place])    s/p enucleation    PAST SURGICAL HISTORY:   Past Surgical History:  Procedure Laterality Date  . CHOLECYSTECTOMY    . JOINT REPLACEMENT     total right knee    SOCIAL HISTORY:   Social History   Tobacco Use  . Smoking status: Never Smoker  . Smokeless tobacco: Never Used  Substance Use Topics  . Alcohol use: No    FAMILY HISTORY:   Family History  Problem Relation Age of Onset  . Diabetes Mother   . Heart disease Father     DRUG ALLERGIES:  No Known Allergies  MEDICATIONS AT HOME:   Prior to Admission medications   Medication Sig Start Date End Date Taking? Authorizing Provider  acetaminophen (TYLENOL) 500 MG tablet Take 500 mg by mouth every 8 (eight) hours as needed for moderate pain.   Yes [provider]  amoxicillin-clavulanate (AUGMENTIN) 875-125 MG tablet Take 1 tablet 2 (two) times daily by mouth. 10/10/17 10/20/17 Yes [provider]  benzonatate (TESSALON) 200 MG capsule Take 200 mg by mouth 3 (three) times daily as needed for cough.   Yes [provider]  donepezil (ARICEPT) 10 MG tablet Take 10 mg by mouth at bedtime.   Yes [provider]  ergocalciferol (DRISDOL) 50000 UNITS capsule Take 1 capsule (50,000 Units total) by mouth once a week. Patient taking differently:  Take 50,000 Units by mouth every Monday.  03/26/15  Yes Crecencio Mc, MD  guaifenesin (ROBITUSSIN) 100 MG/5ML syrup Take 200 mg by mouth every 8 (eight) hours as needed for congestion.   Yes [provider]  hydrocortisone (ANUSOL-HC) 2.5 % rectal cream Place 1 application rectally 2 (two) times daily. Patient taking differently: Place 1 application rectally every 12 (twelve) hours as needed for hemorrhoids.  02/12/14  Yes Crecencio Mc, MD  insulin NPH-regular Human (NOVOLIN 70/30) (70-30) 100 UNIT/ML injection 10 UNITS BEFORE BREAKFAST,  INCREASE TO  15 FOR bx > 250 And 18 units before dinner  Hold either  dose for BS <  130 Patient taking differently: Inject 12 Units daily with breakfast into the skin.  06/24/15  Yes Crecencio Mc, MD  insulin regular (NOVOLIN R,HUMULIN R) 100 units/mL injection Inject 20 Units every evening into the skin. With dinner  Also per sliding scale 151-200=3 units 201-250=6 units 251-300=10 units 301-350=14 units 351-400=16 units   Yes [provider]  ipratropium-albuterol (DUONEB) 0.5-2.5 (3) MG/3ML SOLN Take 3 mLs every 2 (two) hours as needed by nebulization (for congestion).   Yes [provider]  loperamide (IMODIUM) 2 MG capsule Take 2 capsules (4 mg total) by mouth as needed for diarrhea or loose stools (max 6 tablets daily). Patient taking differently: Take 2 mg by mouth every 8 (eight) hours as needed for diarrhea or loose stools.  05/03/12  Yes Crecencio Mc, MD  mirtazapine (REMERON) 30 MG tablet Take 1 tablet (30 mg total) by mouth at bedtime. 08/30/15  Yes Crecencio Mc, MD  topiramate (TOPAMAX) 25 MG tablet Take 25 mg by mouth at bedtime.    Yes [provider]  insulin aspart (NOVOLOG) 100 UNIT/ML injection Give before lunch sliding scale: CBG  151 to 200: Give 3 units; CBG 201 to 250:  Give  6 units; CBG 251 to 300  Give 10 units, CBG 301 to 350   Give 14 units Patient not taking: Reported on 10/11/2017 07/02/15   Crecencio Mc, MD  magnesium hydroxide (MILK OF MAGNESIA) 400 MG/5ML suspension Take 30 mLs by mouth daily as needed for mild constipation or moderate constipation.     [provider]    REVIEW OF SYSTEMS:  Review of Systems  Unable to perform ROS: Acuity of condition     VITAL SIGNS:   Vitals:   10/11/17 2100 10/11/17 2115 10/11/17 2130 10/11/17 2133  BP: 97/65 94/68 102/88 102/88  Pulse: (!) 128   (!) 123  Resp: 18   (!) 24  Temp: (!) 96.3 F (35.7 C) (!) 96.2 F (35.7 C) (!) 96.1 F (35.6 C)   TempSrc:      SpO2: 98%   92%   Weight:      Height:       Wt Readings from Last 3 Encounters:  10/11/17 63.5 kg (140 lb)  05/26/17 72.6 kg (160 lb)  12/18/15 68.5 kg (151 lb)    PHYSICAL EXAMINATION:  Physical Exam  Vitals reviewed. Constitutional: She appears well-developed and well-nourished.  HENT:  Head: Normocephalic and atraumatic.  Mouth/Throat: Oropharynx is clear and moist.  Eyes: Conjunctivae and EOM are normal. Pupils are equal, round, and reactive to light. No scleral icterus.  Neck: Normal range of motion. Neck supple. No JVD present. No thyromegaly present.  Cardiovascular: Normal rate, regular rhythm and intact distal pulses. Exam reveals no gallop and no friction rub.  No murmur heard. Respiratory: Effort  normal and breath sounds normal. No respiratory distress. She has no wheezes. She has no rales.  GI: Soft. Bowel sounds are normal. She exhibits no distension. There is no tenderness.  Musculoskeletal: Normal range of motion. She exhibits no edema.  No arthritis, no gout  Lymphadenopathy:    She has no cervical adenopathy.  Neurological:  Nonresponsive  Skin: Skin is warm and dry. No rash noted. No erythema.  Psychiatric:  Unable to assess as the patient is nonresponsive    LABORATORY PANEL:   CBC Recent Labs  Lab 10/11/17 2031  WBC 10.5  HGB 5.9*  HCT 22.0*  PLT 39*   ------------------------------------------------------------------------------------------------------------------  Chemistries  Recent Labs  Lab 10/11/17 2031  NA 148*  K 4.1  CL 119*  CO2 17*  GLUCOSE 173*  BUN 88*  CREATININE 2.23*  CALCIUM 8.8*  AST 528*  ALT 890*  ALKPHOS 193*  BILITOT 2.1*   ------------------------------------------------------------------------------------------------------------------  Cardiac Enzymes Recent Labs  Lab 10/11/17 2031  TROPONINI 0.08*    ------------------------------------------------------------------------------------------------------------------  RADIOLOGY:  Dg Chest Port 1 View  Result Date: 10/11/2017 CLINICAL DATA:  83 year old with sepsis. EXAM: PORTABLE CHEST 1 VIEW COMPARISON:  12/29/2009 and earlier. FINDINGS: Cardiac silhouette mildly enlarged. LAD coronary stent. Thoracic aorta mildly atherosclerotic, unchanged. Small hiatal hernia. Hilar and mediastinal contours otherwise unremarkable. Lungs clear. Bronchovascular markings normal. Pulmonary vascularity normal. No visible pleural effusions. No pneumothorax. IMPRESSION: 1. Mild cardiomegaly.  No acute cardiopulmonary disease. 2. Small hiatal hernia. 3.  Aortic Atherosclerosis (ICD10-170.0) Electronically Signed   By: Evangeline Dakin M.D.   On: 10/11/2017 20:52    EKG:   Orders placed or performed during the hospital encounter of 10/11/17  . ED EKG 12-Lead  . ED EKG 12-Lead  . EKG 12-Lead  . EKG 12-Lead    IMPRESSION AND PLAN:  Principal Problem:   Severe sepsis ([place]) -suspected urinary source, IV antibiotics in place, fluids in place for elevated lactic acid, trend lactate until within normal limits, cultures sent, other treatment as below.  Patient is also anemic, with some renal dysfunction, and possibility of TTP was discussed significantly with the family, and they were informed that we do not offer plasmapheresis here and a diagnosis of TTP would be fatal for the patient.  They did not want the patient transferred, and preferred to treat for sepsis and DIC for now. Active Problems:   Acute cystitis with hematuria -IV antibiotics as above   Acute on chronic kidney failure (HCC) -fluids as above   Acute liver failure -suspect this is due to DIC, versus potential previous septic shock, although the patient's blood pressure is relatively stable at this time with a decent MAP, versus simply part of her multiorgan failure   DIC (disseminated intravascular  coagulation) (HCC) -fibrinogen 7000, schistocytes on peripheral smear, we will administer FFP and recheck fibrinogen   Diabetes mellitus type 2 in obese (HCC) -sliding scale insulin   Dementia arising in the senium and presenium -patient is minimally communicative or responsive at baseline, she is completely nonresponsive currently  All the records are reviewed and case discussed with ED provider. Management plans discussed with the patient and/or family.  DVT PROPHYLAXIS: Mechanical only  GI PROPHYLAXIS: None  ADMISSION STATUS: Inpatient  CODE STATUS: DNR    Code Status Orders  (From admission, onward)        Start     Ordered   10/11/17 2129  Do not attempt resuscitation/DNR  Continuous    Question Answer Comment  In  the event of cardiac or respiratory ARREST Do not call a "code blue"   In the event of cardiac or respiratory ARREST Do not perform Intubation, CPR, defibrillation or ACLS   In the event of cardiac or respiratory ARREST Use medication by any route, position, wound care, and other measures to relive pain and suffering. May use oxygen, suction and manual treatment of airway obstruction as needed for comfort.      10/11/17 2128    Code Status History    Date Active Date Inactive Code Status Order ID Comments User Context   12/18/2015 15:39 12/23/2015 20:02 Full Code [id number]  Henreitta Leber, MD ED    DNR  TOTAL CRITICAL CARE TIME TAKING CARE OF THIS PATIENT: 50 minutes.   Trica Usery FIELDING 10/11/2017, 9:52 PM  [employer]  [phone]  CC: Primary care physician; Crecencio Mc, MD  Note:  This document was prepared using Dragon voice recognition software and may include unintentional dictation errors.

## 2017-10-11 NOTE — Consult Note (Signed)
Name: Theresa Hood MRN: [id number] DOB: January 23, 1925    ADMISSION DATE:  10/11/2017 CONSULTATION DATE: 10/11/2017  REFERRING MD : Dr. Jannifer Franklin  CHIEF COMPLAINT: Abnormal Labs    BRIEF PATIENT DESCRIPTION: 83 yo female admitted 11/5 with acute hypoxic respiratory failure secondary pneumonia, sepsis with hypotension secondary to UTI, acute renal failure; anemia, thrombocytopenia with elevated fibrinogen levels concerning for possible DIC  SIGNIFICANT EVENTS  11/5-Pt admitted to ICU   STUDIES:  None   HISTORY OF PRESENT ILLNESS:   This is a 83 yo female with a PMH of HTN, Hyperlipidemia, Diabetes Mellitus, Depression, Advanced Dementia-Nonverbal, and CAD.  She presented to [facility] ER 11/5 from nursing facility via EMS with abnormal labs (elevated BUN and creatinine), and very poor po intake over the last several days.  In the ER she was hypotensive with systolic bp 59'Y, hypothermic, and hypoxic with O2 sats 80%.  She was recently diagnosed with LLL pneumonia and has been taking Augmentin. In the ER lab results revealed creatinine 2.23, AST 528, ALT 890, Alk phos 193, BNP 1,123, Hgb 5.9, platelets 39, lactic acid 2.7, fibrinogen 7,383.87, PT 32, and INR 3.14. After reviewing outpatient medications pt does not take anticoagulants labs results concerning for possible DIC and sepsis.  Orders placed to transfuse 2 units pRBC's and 3 units of FFP no signs of active bleeding.  She was subsequently admitted to ICU by hospitalist team for further workup and treatment PCCM consulted.   PAST MEDICAL HISTORY :   has a past medical history of Coronary artery disease, Dementia arising in the senium and presenium, Depression, major, in remission ([place]), Diabetes mellitus, Hyperlipidemia, Hypertension, and Melanoma, choroid, left eye ([place]).  has a past surgical history that includes Joint replacement and Cholecystectomy. Prior to Admission medications   Medication Sig Start Date End Date Taking? Authorizing  Provider  acetaminophen (TYLENOL) 500 MG tablet Take 500 mg by mouth every 8 (eight) hours as needed for moderate pain.   Yes [provider]  amoxicillin-clavulanate (AUGMENTIN) 875-125 MG tablet Take 1 tablet 2 (two) times daily by mouth. 10/10/17 10/20/17 Yes [provider]  benzonatate (TESSALON) 200 MG capsule Take 200 mg by mouth 3 (three) times daily as needed for cough.   Yes [provider]  donepezil (ARICEPT) 10 MG tablet Take 10 mg by mouth at bedtime.   Yes [provider]  ergocalciferol (DRISDOL) 50000 UNITS capsule Take 1 capsule (50,000 Units total) by mouth once a week. Patient taking differently: Take 50,000 Units by mouth every Monday.  03/26/15  Yes Crecencio Mc, MD  guaifenesin (ROBITUSSIN) 100 MG/5ML syrup Take 200 mg by mouth every 8 (eight) hours as needed for congestion.   Yes [provider]  hydrocortisone (ANUSOL-HC) 2.5 % rectal cream Place 1 application rectally 2 (two) times daily. Patient taking differently: Place 1 application rectally every 12 (twelve) hours as needed for hemorrhoids.  02/12/14  Yes Crecencio Mc, MD  insulin NPH-regular Human (NOVOLIN 70/30) (70-30) 100 UNIT/ML injection 10 UNITS BEFORE BREAKFAST,  INCREASE TO 15 FOR bx > 250 And 18 units before dinner  Hold either  dose for BS <  130 Patient taking differently: Inject 12 Units daily with breakfast into the skin.  06/24/15  Yes Crecencio Mc, MD  insulin regular (NOVOLIN R,HUMULIN R) 100 units/mL injection Inject 20 Units every evening into the skin. With dinner  Also per sliding scale 151-200=3 units 201-250=6 units 251-300=10 units 301-350=14 units 351-400=16 units   Yes [provider]  ipratropium-albuterol (DUONEB) 0.5-2.5 (3) MG/3ML SOLN Take 3 mLs every 2 (two) hours as needed by nebulization (for congestion).   Yes [provider]  loperamide (IMODIUM) 2 MG capsule Take 2 capsules (4 mg total) by mouth as needed for  diarrhea or loose stools (max 6 tablets daily). Patient taking differently: Take 2 mg by mouth every 8 (eight) hours as needed for diarrhea or loose stools.  05/03/12  Yes Crecencio Mc, MD  mirtazapine (REMERON) 30 MG tablet Take 1 tablet (30 mg total) by mouth at bedtime. 08/30/15  Yes Crecencio Mc, MD  topiramate (TOPAMAX) 25 MG tablet Take 25 mg by mouth at bedtime.    Yes [provider]  insulin aspart (NOVOLOG) 100 UNIT/ML injection Give before lunch sliding scale: CBG  151 to 200: Give 3 units; CBG 201 to 250:  Give  6 units; CBG 251 to 300  Give 10 units, CBG 301 to 350   Give 14 units Patient not taking: Reported on 10/11/2017 07/02/15   Crecencio Mc, MD  magnesium hydroxide (MILK OF MAGNESIA) 400 MG/5ML suspension Take 30 mLs by mouth daily as needed for mild constipation or moderate constipation.     [provider]   No Known Allergies  FAMILY HISTORY:  family history includes Diabetes in her mother; Heart disease in her father. SOCIAL HISTORY:  reports that  has never smoked. she has never used smokeless tobacco. She reports that she does not drink alcohol or use drugs.  REVIEW OF SYSTEMS: Unable to assess pt nonverbal at baseline  SUBJECTIVE:  Unable to assess pt nonverbal at baseline  VITAL SIGNS: Temp:  [95.3 F (35.2 C)-96.3 F (35.7 C)] 96 F (35.6 C) (11/05 2230) Pulse Rate:  [92-128] 115 (11/05 2208) Resp:  [11-24] 15 (11/05 2230) BP: (85-103)/(49-88) 90/59 (11/05 2230) SpO2:  [69 %-100 %] 100 % (11/05 2208) Weight:  [63.5 kg (140 lb)] 63.5 kg (140 lb) (11/05 2021)  PHYSICAL EXAMINATION: General: chronically ill appearing female resting in bed, NAD  Neuro: nonverbal at baseline, not following commands, withdraws from painful stimulation HEENT: supple, no JVD Cardiovascular: nsr, rrr, no M/R/G Lungs: clear throughout, even, non labored Abdomen: hypoactive BS x4, soft, non distended  Musculoskeletal: 3+ LUE swelling, 2+ bilateral lower  extremities Skin: deep tissue injury bilateral heels   Recent Labs  Lab 10/11/17 2031  NA 148*  K 4.1  CL 119*  CO2 17*  BUN 88*  CREATININE 2.23*  GLUCOSE 173*   Recent Labs  Lab 10/11/17 2031  HGB 5.9*  HCT 22.0*  WBC 10.5  PLT 39*   Dg Chest Port 1 View  Result Date: 10/11/2017 CLINICAL DATA:  83 year old with sepsis. EXAM: PORTABLE CHEST 1 VIEW COMPARISON:  12/29/2009 and earlier. FINDINGS: Cardiac silhouette mildly enlarged. LAD coronary stent. Thoracic aorta mildly atherosclerotic, unchanged. Small hiatal hernia. Hilar and mediastinal contours otherwise unremarkable. Lungs clear. Bronchovascular markings normal. Pulmonary vascularity normal. No visible pleural effusions. No pneumothorax. IMPRESSION: 1. Mild cardiomegaly.  No acute cardiopulmonary disease. 2. Small hiatal hernia. 3.  Aortic Atherosclerosis (ICD10-170.0) Electronically Signed   By: Evangeline Dakin M.D.   On: 10/11/2017 20:52    ASSESSMENT / PLAN: Septic shock secondary to UTI Hypotension secondary to sepsis and hypovolemia Anemia and thrombocytopenia concerning for possible DIC  Acute renal failure Transminitis secondary to sepsis Mildly elevated troponin's likely demand ischemia  Diabetes Mellitus P: Supplemental O2 to maintain O2 sats >92% and/or for dyspnea Trend WBC and monitor  fever curve Trend PCT and lactic acid Follow cultures Continue current abx Trend CBC Monitor for s/sx of bleeding Transfuse for hgb <7 or for signs of active bleeding Plans to transfuse 2 units pRBC's and 3 units FFP-recheck H&H and fibrinogen levels post transfusion Repeat coags in the am  SCD's for VTE prophylaxis, no chemical prophylaxis Maintain map >65 NS @100  ml/hr  Trend CMP Replace electrolytes as indicated Monitor UOP Trend troponin's  Continuous telemetry monitoring SSI  Prn warming blanket   Marda Stalker, [place] Pager [phone] (please enter 7 digits) PCCM Consult Pager  [phone] (please enter 7 digits)

## 2017-10-11 NOTE — ED Notes (Signed)
Date and time results received: 10/11/17 @ 9:14pm Test: Troponin Critical Value: 0.08 ng/mL Name of Provider Notified: Rifenbark, MD Orders Received? Or Actions Taken?: No new orders; continue to monitor

## 2017-10-11 NOTE — ED Triage Notes (Signed)
Pt presents via EMS. Pt has recent PMH of LLL pneumonia. Pt reported by NH staff to have decreased/no PO intake x 3-4 days. Pt sent from facility for abnormal labs. Pt is poorly responsive and non-verbal, unable to follow commands.

## 2017-10-11 NOTE — ED Notes (Addendum)
Date and time results received: 10/11/17 @ 9:11pm Test: Lactic Acid Critical Value: 2.7 mmol/L Name of Provider Notified: Rifenbark, MD Orders Received? Or Actions Taken?: No new orders; continue to monitor

## 2017-10-12 ENCOUNTER — Other Ambulatory Visit: Payer: Self-pay

## 2017-10-12 ENCOUNTER — Inpatient Hospital Stay: Payer: Medicare Other

## 2017-10-12 ENCOUNTER — Encounter: Payer: Self-pay | Admitting: *Deleted

## 2017-10-12 DIAGNOSIS — R6520 Severe sepsis without septic shock: Secondary | ICD-10-CM

## 2017-10-12 DIAGNOSIS — F0390 Unspecified dementia without behavioral disturbance: Secondary | ICD-10-CM

## 2017-10-12 DIAGNOSIS — A419 Sepsis, unspecified organism: Principal | ICD-10-CM

## 2017-10-12 DIAGNOSIS — N3001 Acute cystitis with hematuria: Secondary | ICD-10-CM

## 2017-10-12 LAB — COMPREHENSIVE METABOLIC PANEL
ALT: 678 U/L — AB (ref 14–54)
AST: UNDETERMINED U/L (ref 15–41)
Albumin: 2.9 g/dL — ABNORMAL LOW (ref 3.5–5.0)
Alkaline Phosphatase: 162 U/L — ABNORMAL HIGH (ref 38–126)
Anion gap: 13 (ref 5–15)
BUN: 85 mg/dL — AB (ref 6–20)
CHLORIDE: 125 mmol/L — AB (ref 101–111)
CO2: 12 mmol/L — ABNORMAL LOW (ref 22–32)
CREATININE: 1.95 mg/dL — AB (ref 0.44–1.00)
Calcium: 8.3 mg/dL — ABNORMAL LOW (ref 8.9–10.3)
GFR, EST AFRICAN AMERICAN: 25 mL/min — AB (ref >60)
GFR, EST NON AFRICAN AMERICAN: 21 mL/min — AB (ref >60)
Glucose, Bld: 179 mg/dL — ABNORMAL HIGH (ref 65–99)
Potassium: 4.5 mmol/L (ref 3.5–5.1)
Sodium: 150 mmol/L — ABNORMAL HIGH (ref 135–145)
TOTAL PROTEIN: 5.5 g/dL — AB (ref 6.5–8.1)
Total Bilirubin: 3.4 mg/dL — ABNORMAL HIGH (ref 0.3–1.2)

## 2017-10-12 LAB — PHOSPHORUS: Phosphorus: 4.5 mg/dL (ref 2.5–4.6)

## 2017-10-12 LAB — MAGNESIUM: Magnesium: 2.6 mg/dL — ABNORMAL HIGH (ref 1.7–2.4)

## 2017-10-12 LAB — PATHOLOGIST SMEAR REVIEW

## 2017-10-12 LAB — TROPONIN I
TROPONIN I: 0.06 ng/mL — AB (ref <0.03)
TROPONIN I: 0.07 ng/mL — AB (ref <0.03)

## 2017-10-12 LAB — GLUCOSE, CAPILLARY
GLUCOSE-CAPILLARY: 171 mg/dL — AB (ref 65–99)
GLUCOSE-CAPILLARY: 134 mg/dL — AB (ref 65–99)
Glucose-Capillary: 171 mg/dL — ABNORMAL HIGH (ref 65–99)

## 2017-10-12 LAB — LACTIC ACID, PLASMA: LACTIC ACID, VENOUS: 2.3 mmol/L — AB (ref 0.5–1.9)

## 2017-10-12 LAB — MRSA PCR SCREENING: MRSA by PCR: POSITIVE — AB

## 2017-10-12 MED ORDER — DEXTROSE 5 % IV SOLN
1.0000 g | INTRAVENOUS | Status: DC
  Administered 2017-10-12: 1 g via INTRAVENOUS
  Filled 2017-10-12 (×2): qty 10

## 2017-10-12 MED ORDER — SCOPOLAMINE HBR 0.4 MG/ML IJ SOLN
0.4000 mg | INTRAMUSCULAR | Status: DC | PRN
  Filled 2017-10-12: qty 1

## 2017-10-12 MED ORDER — MORPHINE BOLUS VIA INFUSION: 5.0000 mg | INTRAVENOUS | Status: DC | PRN

## 2017-10-12 MED ORDER — MUPIROCIN 2 % EX OINT
1.0000 "application " | TOPICAL_OINTMENT | Freq: Two times a day (BID) | CUTANEOUS | Status: DC
  Administered 2017-10-12: 1 via NASAL
  Filled 2017-10-12: qty 22

## 2017-10-12 MED ORDER — MORPHINE SULFATE (PF) 4 MG/ML IV SOLN
5.0000 mg | INTRAVENOUS | Status: DC | PRN
  Filled 2017-10-12: qty 5

## 2017-10-12 MED ORDER — PIPERACILLIN-TAZOBACTAM 3.375 G IVPB: 3.3750 g | Freq: Two times a day (BID) | INTRAVENOUS | Status: DC

## 2017-10-12 MED ORDER — CHLORHEXIDINE GLUCONATE CLOTH 2 % EX PADS
6.0000 | MEDICATED_PAD | Freq: Every day | CUTANEOUS | Status: DC
  Administered 2017-10-12 – 2017-10-14 (×3): 6 via TOPICAL

## 2017-10-12 MED ORDER — SODIUM CHLORIDE 0.9 % IV SOLN
500.0000 mg | INTRAVENOUS | Status: DC
  Filled 2017-10-12: qty 500

## 2017-10-12 NOTE — Progress Notes (Signed)
Report called, and given to [place].  All questions answered, no concerns at this time.  Plan of care to continue.

## 2017-10-12 NOTE — Progress Notes (Signed)
Pharmacy Antibiotic Note  Theresa Hood is a 83 y.o. female admitted on 10/11/2017 with sepsis secondary to UTI.  Pharmacy has been consulted for ceftriaxone dosing.  Plan: Patient received vanc 1g and zosyn 3.375g IV x 1 in ED.   Will initiate ceftriaxone 1g Q24H. Pharmacy will continue to monitor and adjust as needed.   Height: 5\' 3"  (160 cm) Weight: 140 lb (63.5 kg) IBW/kg (Calculated) : 52.4  Temp (24hrs), Avg:97.4 F (36.3 C), Min:95.3 F (35.2 C), Max:99.1 F (37.3 C)  Recent Labs  Lab 10/11/17 2031 10/11/17 2353  WBC 10.5  --   CREATININE 2.23*  --   LATICACIDVEN 2.7* 2.3*    Estimated Creatinine Clearance: 15 mL/min (A) (by C-G formula based on SCr of 2.23 mg/dL (H)).    No Known Allergies  Thank you for allowing pharmacy to be a part of this patient's care.  Lendon Ka, PharmD Pharmacy Resident 10/12/2017

## 2017-10-12 NOTE — Progress Notes (Signed)
In to patient room.  Pt in bed, bed locked and in low position.  No complaints at this time.  When asked if she had pain, stated "no".  VSS.  Will continue to monitor.

## 2017-10-12 NOTE — Progress Notes (Signed)
[place] at [place] NAME: Theresa Hood    MR#:  [id number]  DATE OF BIRTH:  20-Feb-1925  SUBJECTIVE:  Came in with abnormal labs found to have septic shock Nonverbal unresponsive  REVIEW OF SYSTEMS:   Review of Systems  Unable to perform ROS: Patient nonverbal     DRUG ALLERGIES:  No Known Allergies  VITALS:  Blood pressure 114/73, pulse 74, temperature 98.1 F (36.7 C), resp. rate 13, height 5\' 3"  (1.6 m), weight 63.5 kg (140 lb), SpO2 97 %.  PHYSICAL EXAMINATION:   Physical Exam limited exam secondary to patient's dementia and being nonverbal  GENERAL:  83 y.o.-year-old patient lying in the bed with no acute distress.  Critically ill EYES: Pupils equal, round, reactive to light and accommodation. No scleral icterus. Extraocular muscles intact.  HEENT: Head atraumatic, normocephalic. Oropharynx and nasopharynx clear.  LUNGS: Normal breath sounds bilaterally, no wheezing, rales, rhonchi. No use of accessory muscles of respiration.  CARDIOVASCULAR: S1, S2 normal. No murmurs, rubs, or gallops.  Tachycardia ABDOMEN: Soft, nontender, nondistended. Bowel sounds present. No organomegaly or mass.  EXTREMITIES: No cyanosis, clubbing or edema b/l.    NEUROLOGIC: Unable to assess PSYCHIATRIC: Nonverbal SKIN: Unable to assess  LABORATORY PANEL:  CBC Recent Labs  Lab 10/11/17 2031  WBC 10.5  HGB 5.9*  HCT 22.0*  PLT 39*    Chemistries  Recent Labs  Lab 10/12/17 0502  NA 150*  K 4.5  CL 125*  CO2 12*  GLUCOSE 179*  BUN 85*  CREATININE 1.95*  CALCIUM 8.3*  MG 2.6*  AST QUANTITY NOT SUFFICIENT, UNABLE TO PERFORM TEST  ALT 678*  ALKPHOS 162*  BILITOT 3.4*   Cardiac Enzymes Recent Labs  Lab 10/12/17 0502  TROPONINI 0.06*   RADIOLOGY:  Dg Chest Port 1 View  Result Date: 10/12/2017 CLINICAL DATA:  Sepsis, diabetes mellitus, hyperlipidemia, dementia, coronary artery disease EXAM: PORTABLE CHEST 1 VIEW  COMPARISON:  Portable exam 0739 hours compared to 10/11/2017 FINDINGS: Enlargement of cardiac silhouette. Atherosclerotic calcification aorta. Probable hiatal hernia. Mediastinal contours and pulmonary vascularity otherwise normal. Lungs clear. No pleural effusion or pneumothorax. Bones demineralized. IMPRESSION: No acute abnormalities. Mild enlargement of cardiac silhouette. Probable hiatal hernia. Electronically Signed   By: Lavonia Dana M.D.   On: 10/12/2017 10:39   Dg Chest Port 1 View  Result Date: 10/11/2017 CLINICAL DATA:  83 year old with sepsis. EXAM: PORTABLE CHEST 1 VIEW COMPARISON:  12/29/2009 and earlier. FINDINGS: Cardiac silhouette mildly enlarged. LAD coronary stent. Thoracic aorta mildly atherosclerotic, unchanged. Small hiatal hernia. Hilar and mediastinal contours otherwise unremarkable. Lungs clear. Bronchovascular markings normal. Pulmonary vascularity normal. No visible pleural effusions. No pneumothorax. IMPRESSION: 1. Mild cardiomegaly.  No acute cardiopulmonary disease. 2. Small hiatal hernia. 3.  Aortic Atherosclerosis (ICD10-170.0) Electronically Signed   By: Evangeline Dakin M.D.   On: 10/11/2017 20:52   ASSESSMENT AND PLAN:  Theresa Hood  is a 83 y.o. female who presents with angina at baseline who comes to the ED today due to abnormal labs.  She had BUN and creatinine checked and they were elevated, so she was sent to the ED for further evaluation.  She has significant the altered, was found initially to be hypothermic and blood pressure borderline hypotensive.  Initial lab evaluation in the ED showed she was coagulopathic with an INR of 3 not on blood thinners, was then found to have acute liver failure, anemia, severe thrombocytopenia, with urine highly suspicious for infection.  1.Septic shock secondary to UTI -Patient getting aggressive IV hydration.  She was found to be hypothermic tachycardic elevated white count and abnormal UA.  Lactic acid was elevated.  She is on  broad-spectrum antibiotic with IV Rocephin -Received a dose of Vanco and Zosyn. -Cultures pending.  2.  Hypotension secondary to sepsis and hypovolemia -Continue aggressive IV hydration  3. Anemia and thrombocytopenia concerning for possible DIC  Patient getting FFP -INR 3  4 acute renal failure--ATN in the setting of septic shock -Getting aggressive IV fluids -Monitor creatinine -Avoid nephrotoxins  5.  Transminitis secondary to sepsis  6.  Mildly elevated troponin's likely demand ischemia   7.  Diabetes Mellitus SSi  Case discussed with Care Management/Social Worker. Management plans discussed with the patient, family and they are in agreement.  CODE STATUS: DNR  Patient has a very poor prognosis.  TOTAL TIME TAKING CARE OF THIS PATIENT: 30 minutes.  >50% time spent on counselling and coordination of care    Note: This dictation was prepared with Dragon dictation along with smaller phrase technology. Any transcriptional errors that result from this process are unintentional.  Add Dinapoli M.D on 10/12/2017 at 3:50 PM  Between 7am to 6pm - Pager - [phone]  After 6pm go to [email or web address] - password [email or web address]  Office  [phone]  CC: Primary care physician; Crecencio Mc, MD

## 2017-10-12 NOTE — Progress Notes (Signed)
eLink Physician-Brief Progress Note Patient Name: Antwanette Wesche DOB: [birth year] MRN: [id number]   Date of Service  10/12/2017  HPI/Events of Note  83 yo female presents with ALOC and hypothermia. Felt to be septic from urine. Although PCT only = 0.33. DDx - Sepsis/MSOF/DIC vs TTP/ITP. Patient is DNR, however, patient doesn't want to transfer her or make her comfort measures. PCCM asked to assume care in ICU. VSS. Continue current management.  eICU Interventions  No new orders.      Intervention Category Evaluation Type: New Patient Evaluation  Lysle Dingwall 10/12/2017, 12:10 AM

## 2017-10-12 NOTE — Progress Notes (Signed)
21 NP talked with sons and patient made comfort care.

## 2017-10-12 NOTE — Progress Notes (Signed)
Pharmacy Antibiotic Note  Theresa Hood is a 83 y.o. female admitted on 10/11/2017 with sepsis.  Pharmacy has been consulted for vanc/zosyn dosing.  Plan: Patient received vanc 1g and zosyn 3.375g IV x 1 in ED  Will continue vanc 500 mg IV q24h Will check VT 11/8 @ 2000 prior to 4th dose. Will start zosyn 3.375g IV q12h for CrCl < 20 ml/min.  Ke 0.0168 T1/2 41 hrs Will half dose and give q24h. Goal trough 15 - 20 mcg/mL  Height: 5\' 3"  (160 cm) Weight: 140 lb (63.5 kg) IBW/kg (Calculated) : 52.4  Temp (24hrs), Avg:96.1 F (35.6 C), Min:95.3 F (35.2 C), Max:96.8 F (36 C)  Recent Labs  Lab 10/11/17 2031  WBC 10.5  CREATININE 2.23*  LATICACIDVEN 2.7*    Estimated Creatinine Clearance: 15 mL/min (A) (by C-G formula based on SCr of 2.23 mg/dL (H)).    No Known Allergies  Thank you for allowing pharmacy to be a part of this patient's care.  Tobie Lords, PharmD, BCPS Clinical Pharmacist 10/12/2017

## 2017-10-13 LAB — BPAM FFP
BLOOD PRODUCT EXPIRATION DATE: 201811102359
BLOOD PRODUCT EXPIRATION DATE: 201811112359
Blood Product Expiration Date: 201811112359
ISSUE DATE / TIME: 201811060404
ISSUE DATE / TIME: 201811061037
ISSUE DATE / TIME: 201811061527
Unit Type and Rh: 5100
Unit Type and Rh: 5100
Unit Type and Rh: 5100

## 2017-10-13 LAB — TYPE AND SCREEN
ABO/RH(D): O POS
ANTIBODY SCREEN: NEGATIVE
UNIT DIVISION: 0
UNIT DIVISION: 0

## 2017-10-13 LAB — HEPATITIS PANEL, ACUTE
HCV Ab: 0.2 s/co ratio (ref 0.0–0.9)
HEP B C IGM: NEGATIVE
HEP B S AG: NEGATIVE
Hep A IgM: UNDETERMINED

## 2017-10-13 LAB — PREPARE FRESH FROZEN PLASMA
Unit division: 0
Unit division: 0
Unit division: 0

## 2017-10-13 LAB — BPAM RBC
BLOOD PRODUCT EXPIRATION DATE: 201811152359
Blood Product Expiration Date: 201811302359
ISSUE DATE / TIME: 201811060020
ISSUE DATE / TIME: 201811060519
UNIT TYPE AND RH: 5100
UNIT TYPE AND RH: 5100

## 2017-10-13 LAB — URINE CULTURE

## 2017-10-13 LAB — HAPTOGLOBIN: HAPTOGLOBIN: 110 mg/dL (ref 34–200)

## 2017-10-13 NOTE — Care Management Important Message (Signed)
Important Message  Patient Details  Name: Theresa Hood MRN: [id number] Date of Birth: 1925/04/10   Medicare Important Message Given:  Yes    Shelbie Ammons, RN 10/13/2017, 8:03 AM

## 2017-10-13 NOTE — Clinical Social Work Note (Signed)
CSW received consult that patient will need hospice facility placement.  CSW spoke to patient's son Theresa Hood, [phone] who would like [facility], [place] notified nurse liaison, she stated there is a wait list, CSW informed patient's son there is a wait list.  CSW to continue to follow patient's progress throughout discharge planning.  Jones Broom. [place], MSW, [place]  10/13/2017 12:51 PM

## 2017-10-13 NOTE — Progress Notes (Signed)
   10/13/17 1000  Clinical Encounter Type  Visited With Patient  Visit Type Initial;Spiritual support  Referral From Nurse   [facility] provided silent prayer and prayer shawl for patient. Patient has one staff member in room. [place] will offer follow-up when needed.

## 2017-10-13 NOTE — Progress Notes (Signed)
[place] at [place] NAME: Theresa Hood    MR#:  [id number]  DATE OF BIRTH:  September 09, 1925  SUBJECTIVE:  Came in with abnormal labs found to have septic shock Nonverbal unresponsive Patient now transferred to 1 C--she is currently comfort care only No family And appears comfortable REVIEW OF SYSTEMS:   Review of Systems  Unable to perform ROS: Patient nonverbal     DRUG ALLERGIES:  No Known Allergies  VITALS:  Blood pressure 111/77, pulse 71, temperature (!) 97.5 F (36.4 C), temperature source Oral, resp. rate 15, height 5\' 3"  (1.6 m), weight 66 kg (145 lb 6.4 oz), SpO2 (!) 89 %.  PHYSICAL EXAMINATION:   Physical Exam limited exam secondary to patient being comfort care only GENERAL:  83 y.o.-year-old patient lying in the bed with no acute distress.  Critically ill LUNGS: Normal breath sounds bilaterally, no wheezing, rales, rhonchi. No use of accessory muscles of respiration.  CARDIOVASCULAR: S1, S2 normal. No murmurs, rubs, or gallops.  Tachycardia ABDOMEN: Soft, nontender, nondistended. Bowel sounds present. No organomegaly or mass.  NEUROLOGIC: Unable to assess PSYCHIATRIC: Nonverbal SKIN: Unable to assess  LABORATORY PANEL:  CBC Recent Labs  Lab 10/11/17 2031  WBC 10.5  HGB 5.9*  HCT 22.0*  PLT 39*    Chemistries  Recent Labs  Lab 10/12/17 0502  NA 150*  K 4.5  CL 125*  CO2 12*  GLUCOSE 179*  BUN 85*  CREATININE 1.95*  CALCIUM 8.3*  MG 2.6*  AST QUANTITY NOT SUFFICIENT, UNABLE TO PERFORM TEST  ALT 678*  ALKPHOS 162*  BILITOT 3.4*   Cardiac Enzymes Recent Labs  Lab 10/12/17 0502  TROPONINI 0.06*   RADIOLOGY:  Dg Chest Port 1 View  Result Date: 10/12/2017 CLINICAL DATA:  Sepsis, diabetes mellitus, hyperlipidemia, dementia, coronary artery disease EXAM: PORTABLE CHEST 1 VIEW COMPARISON:  Portable exam 0739 hours compared to 10/11/2017 FINDINGS: Enlargement of cardiac silhouette.  Atherosclerotic calcification aorta. Probable hiatal hernia. Mediastinal contours and pulmonary vascularity otherwise normal. Lungs clear. No pleural effusion or pneumothorax. Bones demineralized. IMPRESSION: No acute abnormalities. Mild enlargement of cardiac silhouette. Probable hiatal hernia. Electronically Signed   By: Lavonia Dana M.D.   On: 10/12/2017 10:39   Dg Chest Port 1 View  Result Date: 10/11/2017 CLINICAL DATA:  83 year old with sepsis. EXAM: PORTABLE CHEST 1 VIEW COMPARISON:  12/29/2009 and earlier. FINDINGS: Cardiac silhouette mildly enlarged. LAD coronary stent. Thoracic aorta mildly atherosclerotic, unchanged. Small hiatal hernia. Hilar and mediastinal contours otherwise unremarkable. Lungs clear. Bronchovascular markings normal. Pulmonary vascularity normal. No visible pleural effusions. No pneumothorax. IMPRESSION: 1. Mild cardiomegaly.  No acute cardiopulmonary disease. 2. Small hiatal hernia. 3.  Aortic Atherosclerosis (ICD10-170.0) Electronically Signed   By: Evangeline Dakin M.D.   On: 10/11/2017 20:52   ASSESSMENT AND PLAN:  Theresa Hood  is a 83 y.o. female who presents with angina at baseline who comes to the ED today due to abnormal labs.  She had BUN and creatinine checked and they were elevated, so she was sent to the ED for further evaluation.  She has significant the altered, was found initially to be hypothermic and blood pressure borderline hypotensive.  Initial lab evaluation in the ED showed she was coagulopathic with an INR of 3 not on blood thinners, was then found to have acute liver failure, anemia, severe thrombocytopenia, with urine highly suspicious for infection.   1.Septic shock secondary to UTI  2.  Hypotension secondary to septic shock  3. Anemia and thrombocytopenia concerning for possible DIC   4 acute renal failure--ATN in the setting of septic shock  5.  Transminitis secondary to sepsis  6.  Mildly elevated troponin's likely demand ischemia   7.   Diabetes Mellitus  Patient is transition to comfort care. Social work consult for hospice home screening   CODE STATUS: DNR  Patient has a very poor prognosis.  TOTAL TIME TAKING CARE OF THIS PATIENT: 15 minutes.  >50% time spent on counselling and coordination of care    Note: This dictation was prepared with Dragon dictation along with smaller phrase technology. Any transcriptional errors that result from this process are unintentional.  Katira Dumais M.D on 10/13/2017 at 8:13 AM  Between 7am to 6pm - Pager - [phone]  After 6pm go to [email or web address] - password [email or web address] [facility]  Office  [phone]  CC: Primary care physician; Crecencio Mc, MD

## 2017-10-13 NOTE — Progress Notes (Signed)
Assigned to pt from 1500-1900. Pt on comfort care. Alert to self only. No signes of pain nor discomfort.

## 2017-10-13 NOTE — Progress Notes (Signed)
Nutrition Brief Note  Patient identified to be seen for low Braden score. Chart reviewed. Patient now transitioning to comfort care.   No diet order. No nutrition interventions warranted at this time.  Please consult RD as needed.   Willey Blade, MS, [place], LDN Office: [phone] Pager: [phone] After Hours/Weekend Pager: [phone]

## 2017-10-13 NOTE — Progress Notes (Signed)
New hospice home referral received from [place]. Randall Hiss made aware there is currently no bed availability. Patient information faxed to referral. Writer to contact patient's son Bethani Brugger via telephone, no family at bedside. Thank you. Flo Shanks RN, BSN, [facility] [place], hospital liaison [phone] c

## 2017-10-14 MED ORDER — ORAL CARE MOUTH RINSE
15.0000 mL | Freq: Two times a day (BID) | OROMUCOSAL | Status: DC
  Administered 2017-10-13 – 2017-10-16 (×6): 15 mL via OROMUCOSAL

## 2017-10-14 NOTE — Progress Notes (Signed)
Patient remains on the hospice home wait [place] spoke with both sons, Marya Amsler and Fritz Pickerel, both aware there is currently no bed available for transfer. Son Fritz Pickerel declined to have patient return to [facility] with hospice services. Will continue to follow and update family and hospital care team. Thank you. Flo Shanks RN, BSN, [facility] [place], hospital [phone] c

## 2017-10-14 NOTE — Progress Notes (Signed)
[place] at [place] NAME: Theresa Hood    MR#:  [id number]  DATE OF BIRTH:  24-Nov-1925  SUBJECTIVE:  Came in with abnormal labs found to have septic shock Nonverbal unresponsive Patient now transferred to 1 C--she is currently comfort care only No family And appears comfortable REVIEW OF SYSTEMS:   Review of Systems  Unable to perform ROS: Patient nonverbal   DRUG ALLERGIES:  No Known Allergies  VITALS:  Blood pressure 124/80, pulse (!) 114, temperature (!) 97.4 F (36.3 C), temperature source Oral, resp. rate 16, height 5\' 3"  (1.6 m), weight 66 kg (145 lb 6.4 oz), SpO2 96 %.  PHYSICAL EXAMINATION:   Physical Exam limited exam secondary to patient being comfort care only GENERAL:  83 y.o.-year-old patient lying in the bed with no acute distress.  Critically ill LUNGS: Normal breath sounds bilaterally, no wheezing, rales, rhonchi.  CARDIOVASCULAR: S1, S2 normal. No murmurs, rubs, or gallops.  Tachycardia NEUROLOGIC: Unable to assess PSYCHIATRIC: Nonverbal SKIN: Unable to assess  LABORATORY PANEL:  CBC Recent Labs  Lab 10/11/17 2031  WBC 10.5  HGB 5.9*  HCT 22.0*  PLT 39*    Chemistries  Recent Labs  Lab 10/12/17 0502  NA 150*  K 4.5  CL 125*  CO2 12*  GLUCOSE 179*  BUN 85*  CREATININE 1.95*  CALCIUM 8.3*  MG 2.6*  AST QUANTITY NOT SUFFICIENT, UNABLE TO PERFORM TEST  ALT 678*  ALKPHOS 162*  BILITOT 3.4*   Cardiac Enzymes Recent Labs  Lab 10/12/17 0502  TROPONINI 0.06*   RADIOLOGY:  No results found. ASSESSMENT AND PLAN:  Theresa Hood  is a 83 y.o. female who presents with angina at baseline who comes to the ED today due to abnormal labs.  She had BUN and creatinine checked and they were elevated, so she was sent to the ED for further evaluation.  She has significant the altered, was found initially to be hypothermic and blood pressure borderline hypotensive.  Initial lab evaluation in the ED  showed she was coagulopathic with an INR of 3 not on blood thinners, was then found to have acute liver failure, anemia, severe thrombocytopenia, with urine highly suspicious for infection.   1.Septic shock secondary to UTI  2.  Hypotension secondary to septic shock   3. Anemia and thrombocytopenia concerning for possible DIC   4 acute renal failure--ATN in the setting of septic shock  5.  Transminitis secondary to sepsis  6.  Mildly elevated troponin's likely demand ischemia   7.  Diabetes Mellitus  Patient is transition to comfort care. Social work consult for hospice home placement  CODE STATUS: DNR  Patient has a very poor prognosis.  TOTAL TIME TAKING CARE OF THIS PATIENT: 15 minutes.  >50% time spent on counselling and coordination of care    Note: This dictation was prepared with Dragon dictation along with smaller phrase technology. Any transcriptional errors that result from this process are unintentional.  Arlenne Kimbley M.D on 10/14/2017 at 11:03 AM  Between 7am to 6pm - Pager - [phone]  After 6pm go to [email or web address] - password [email or web address] [facility] [facility]  Office  [phone]  CC: Primary care physician; Crecencio Mc, MD

## 2017-10-15 ENCOUNTER — Other Ambulatory Visit: Payer: Self-pay

## 2017-10-15 NOTE — Clinical Social Work Note (Signed)
Patient continues to be on wait list for hospice facility.  CSW to continue to follow patient's progress throughout discharge planning.  Jones Broom. [place], MSW, [place]  10/15/2017 3:17 PM

## 2017-10-15 NOTE — Progress Notes (Signed)
Visit made. To new hospice home referral. Patient seen lying in bed, eyes closed, mouth breathing. Family at bedside. Mouth care provided. Family made aware patient remains on the wait list. Hospital care team also aware. Questions answered, emotional support given. Comfort care for family ordered by attending physician. Will continue to follow through final disposition.  Flo Shanks RN, BSN, [facility] [place], hospital Liaison [phone] c

## 2017-10-15 NOTE — Plan of Care (Signed)
  Progressing Education: Knowledge of the prescribed therapeutic regimen will improve 10/15/2017 1752 - Progressing by Cherylann Parr, RN Respiratory: Verbalizations of increased ease of respirations will increase 10/15/2017 1752 - Progressing by Cherylann Parr, RN Pain Management: Satisfaction with pain management regimen will improve 10/15/2017 1752 - Progressing by Cherylann Parr, RN

## 2017-10-16 ENCOUNTER — Encounter: Payer: Self-pay | Admitting: *Deleted

## 2017-10-16 LAB — CULTURE, BLOOD (ROUTINE X 2)
CULTURE: NO GROWTH
Culture: NO GROWTH
Special Requests: ADEQUATE

## 2017-10-16 MED ORDER — MORPHINE SULFATE (CONCENTRATE) 10 MG/0.5ML PO SOLN: 10.0000 mg | ORAL | 0 refills | Status: AC | PRN

## 2017-10-16 MED ORDER — MORPHINE SULFATE (CONCENTRATE) 10 MG/0.5ML PO SOLN
10.0000 mg | ORAL | Status: DC | PRN
  Administered 2017-10-16: 10 mg via ORAL
  Filled 2017-10-16: qty 1

## 2017-10-16 NOTE — Discharge Summary (Signed)
[facility] [place] NAME: Theresa Hood    MR#:  [id number]  DATE OF BIRTH:  [birth year]  DATE OF ADMISSION:  10/11/2017 ADMITTING PHYSICIAN: Lance Coon, MD  DATE OF DISCHARGE: 10/16/2017  PRIMARY CARE PHYSICIAN: Crecencio Mc, MD    ADMISSION DIAGNOSIS:  Elevated troponin [R74.8] Multi-organ failure with liver failure ([place]) [K72.90] Sepsis, due to unspecified organism [facility]) [A41.9] Septic shock ([place]) [A41.9, R65.21]  DISCHARGE DIAGNOSIS:  Septic shock Failure to [place]  SECONDARY DIAGNOSIS:   Past Medical History:  Diagnosis Date  . Coronary artery disease   . Dementia arising in the senium and presenium   . Depression, major, in remission ([place])   . Diabetes mellitus   . Hyperlipidemia   . Hypertension   . 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 She had BUN and creatinine checked and they were elevated, so she was sent to the ED for further evaluation. She was found to be hypothermic and blood pressure borderline hypotensive. Initial lab evaluation in the ED showed she was coagulopathic with an INR of 3 not on blood thinners, was then found to have acute liver failure, anemia, severe thrombocytopenia, with urine highly suspicious for infection.   1.Septic shock secondary to UTI  2.  Hypotension secondary to septic shock   3. Anemia and thrombocytopenia concerning for possible DIC   4 acute renal failure--ATN in the setting of septic shock  5.  Transminitis secondary to sepsis  6.  Mildly elevated troponin's likely demand ischemia   7.  Diabetes Mellitus  Patient is transitioned to comfort care. Pt will transfer to hospice facility  CODE STATUS: DNR  Patient has a very poor prognosis.   CONSULTS OBTAINED:    DRUG ALLERGIES:  No Known  Allergies  DISCHARGE MEDICATIONS:   Current Discharge Medication List    START taking these medications   Details  Morphine Sulfate (MORPHINE CONCENTRATE) 10 MG/0.5ML SOLN concentrated solution Take 0.5 mLs (10 mg total) every 2 (two) hours as needed by mouth for severe pain. Qty: 180 mL, Refills: 0      CONTINUE these medications which have NOT CHANGED   Details  acetaminophen (TYLENOL) 500 MG tablet Take 500 mg by mouth every 8 (eight) hours as needed for moderate pain.      STOP taking these medications     amoxicillin-clavulanate (AUGMENTIN) 875-125 MG tablet      benzonatate (TESSALON) 200 MG capsule      donepezil (ARICEPT) 10 MG tablet      ergocalciferol (DRISDOL) 50000 UNITS capsule      guaifenesin (ROBITUSSIN) 100 MG/5ML syrup      hydrocortisone (ANUSOL-HC) 2.5 % rectal cream      insulin NPH-regular Human (NOVOLIN 70/30) (70-30) 100 UNIT/ML injection      insulin regular (NOVOLIN R,HUMULIN R) 100 units/mL injection      ipratropium-albuterol (DUONEB) 0.5-2.5 (3) MG/3ML SOLN      loperamide (IMODIUM) 2 MG capsule      mirtazapine (REMERON) 30 MG tablet      topiramate (TOPAMAX) 25 MG tablet      insulin aspart (NOVOLOG) 100 UNIT/ML injection      magnesium hydroxide (MILK OF MAGNESIA) 400 MG/5ML suspension         If you experience worsening of your admission symptoms,  develop shortness of breath, life threatening emergency, suicidal or homicidal thoughts you must seek medical attention immediately by calling 911 or calling your MD immediately  if symptoms less severe.  You Must read complete instructions/literature along with all the possible adverse reactions/side effects for all the Medicines you take and that have been prescribed to you. Take any new Medicines after you have completely understood and accept all the possible adverse reactions/side effects.   Please note  You were cared for by a hospitalist during your hospital stay. If you have  any questions about your discharge medications or the care you received while you were in the hospital after you are discharged, you can call the unit and asked to speak with the hospitalist on call if the hospitalist that took care of you is not available. Once you are discharged, your primary care physician will handle any further medical issues. Please note that NO REFILLS for any discharge medications will be authorized once you are discharged, as it is imperative that you return to your primary care physician (or establish a relationship with a primary care physician if you do not have one) for your aftercare needs so that they can reassess your need for medications and monitor your lab values. Today   SUBJECTIVE   Resting comfortably  VITAL SIGNS:  Blood pressure 95/68, pulse (!) 133, temperature (!) 96.6 F (35.9 C), temperature source Axillary, resp. rate (!) 26, height 5\' 3"  (1.6 m), weight 66 kg (145 lb 6.4 oz), SpO2 (!) 87 %.  I/O:  No intake or output data in the 24 hours ending 10/16/17 1001  PHYSICAL EXAMINATION:   Physical Exam limited exam secondary to patient being comfort care only GENERAL:  83 y.o.-year-old patient lying in the bed with no acute distress.  Critically ill LUNGS: Normal breath sounds bilaterally, no wheezing, rales, rhonchi.  CARDIOVASCULAR: S1, S2 normal. No murmurs, rubs, or gallops.  Tachycardia NEUROLOGIC: Unable to assess PSYCHIATRIC: Nonverbal SKIN: Unable to assess   DATA REVIEW:   CBC  Recent Labs  Lab 10/11/17 2031  WBC 10.5  HGB 5.9*  HCT 22.0*  PLT 39*    Chemistries  Recent Labs  Lab 10/12/17 0502  NA 150*  K 4.5  CL 125*  CO2 12*  GLUCOSE 179*  BUN 85*  CREATININE 1.95*  CALCIUM 8.3*  MG 2.6*  AST QUANTITY NOT SUFFICIENT, UNABLE TO PERFORM TEST  ALT 678*  ALKPHOS 162*  BILITOT 3.4*    Microbiology Results   Recent Results (from the past 240 hour(s))  Blood Culture (routine x 2)     Status: None   Collection  Time: 10/11/17  8:31 PM  Result Value Ref Range Status   Specimen Description BLOOD RAC  Final   Special Requests   Final    BOTTLES DRAWN AEROBIC AND ANAEROBIC Blood Culture results may not be optimal due to an excessive volume of blood received in culture bottles   Culture NO GROWTH 5 DAYS  Final   Report Status 10/16/2017 FINAL  Final  Blood Culture (routine x 2)     Status: None   Collection Time: 10/11/17  8:31 PM  Result Value Ref Range Status   Specimen Description BLOOD LEFT HAND  Final   Special Requests   Final    BOTTLES DRAWN AEROBIC AND ANAEROBIC Blood Culture adequate volume   Culture NO GROWTH 5 DAYS  Final   Report Status 10/16/2017 FINAL  Final  Urine culture     Status:  Abnormal   Collection Time: 10/11/17  8:32 PM  Result Value Ref Range Status   Specimen Description URINE, RANDOM  Final   Special Requests NONE  Final   Culture 40,000 COLONIES/mL YEAST (A)  Final   Report Status 10/13/2017 FINAL  Final  MRSA PCR Screening     Status: Abnormal   Collection Time: 10/11/17 11:46 PM  Result Value Ref Range Status   MRSA by PCR POSITIVE (A) NEGATIVE Final    Comment:        The GeneXpert MRSA Assay (FDA approved for NASAL specimens only), is one component of a comprehensive MRSA colonization surveillance program. It is not intended to diagnose MRSA infection nor to guide or monitor treatment for MRSA infections. RESULT CALLED TO, READ BACK BY AND VERIFIED WITH: [facility] ABDUSSALAAM @ 4034 ON 10/12/2017 BY CAF     RADIOLOGY:  No results found.   Management plans discussed with the patient, family and they are in agreement.  CODE STATUS:     Code Status Orders  (From admission, onward)        Start     Ordered   10/12/17 1820  DNR (Do not attempt resuscitation)  Continuous    Question Answer Comment  In the event of cardiac or respiratory ARREST Do not call a "code blue"   In the event of cardiac or respiratory ARREST Do not perform Intubation,  CPR, defibrillation or ACLS   In the event of cardiac or respiratory ARREST Use medication by any route, position, wound care, and other measures to relive pain and suffering. May use oxygen, suction and manual treatment of airway obstruction as needed for comfort.      10/12/17 1821    Code Status History    Date Active Date Inactive Code Status Order ID Comments User Context   10/11/2017 23:44 10/12/2017 18:21 DNR [id number]  Lance Coon, MD Inpatient   10/11/2017 21:28 10/11/2017 23:44 DNR [id number]  Darel Hong, MD ED   12/18/2015 15:39 12/23/2015 20:02 Full Code [id number]  Henreitta Leber, MD ED      TOTAL TIME TAKING CARE OF THIS PATIENT: 40 minutes.    Kaydon Creedon M.D on 10/16/2017 at 10:01 AM  Between 7am to 6pm - Pager - [phone] After 6pm go to [email or web address] - password [email or web address] [facility]  Office  [phone]  CC: Primary care physician; Crecencio Mc, MD

## 2017-10-16 NOTE — Clinical Social Work Note (Signed)
The patient will discharge to [facility] today via non-emergent EMS. The patient's family and the facility are aware and in agreement. The packet has been delivered to the chart. CSW will continue to follow pending additional discharge needs.  Santiago Bumpers, MSW, Latanya Presser [phone]

## 2017-10-16 NOTE — Progress Notes (Signed)
[place] [facility] [place] NAME: Theresa Hood    MR#:  [id number]  DATE OF BIRTH:  09/20/1925  SUBJECTIVE:  And appears comfortable REVIEW OF SYSTEMS:   Review of Systems  Unable to perform ROS: Patient nonverbal   DRUG ALLERGIES:  No Known Allergies  VITALS:  Blood pressure 95/68, pulse (!) 133, temperature (!) 96.6 F (35.9 C), temperature source Axillary, resp. rate (!) 26, height 5\' 3"  (1.6 m), weight 66 kg (145 lb 6.4 oz), SpO2 (!) 87 %.  PHYSICAL EXAMINATION:   Physical Exam limited exam secondary to patient being comfort care only GENERAL:  83 y.o.-year-old patient lying in the bed with no acute distress.  Critically ill LUNGS: Normal breath sounds bilaterally, no wheezing, rales, rhonchi.  CARDIOVASCULAR: S1, S2 normal. No murmurs, rubs, or gallops.  Tachycardia NEUROLOGIC: Unable to assess PSYCHIATRIC: Nonverbal SKIN: Unable to assess  LABORATORY PANEL:  CBC Recent Labs  Lab 10/11/17 2031  WBC 10.5  HGB 5.9*  HCT 22.0*  PLT 39*    Chemistries  Recent Labs  Lab 10/12/17 0502  NA 150*  K 4.5  CL 125*  CO2 12*  GLUCOSE 179*  BUN 85*  CREATININE 1.95*  CALCIUM 8.3*  MG 2.6*  AST QUANTITY NOT SUFFICIENT, UNABLE TO PERFORM TEST  ALT 678*  ALKPHOS 162*  BILITOT 3.4*   Cardiac Enzymes Recent Labs  Lab 10/12/17 0502  TROPONINI 0.06*   RADIOLOGY:  No results found. ASSESSMENT AND PLAN:  Theresa Hood  is a 83 y.o. female who presents with angina at baseline who comes to the ED today due to abnormal labs.  She had BUN and creatinine checked and they were elevated, so she was sent to the ED for further evaluation.  She has significant the altered, was found initially to be hypothermic and blood pressure borderline hypotensive.  Initial lab evaluation in the ED showed she was coagulopathic with an INR of 3 not on blood thinners, was then found to have acute liver failure, anemia, severe thrombocytopenia, with  urine highly suspicious for infection.   1.Septic shock secondary to UTI  2.  Hypotension secondary to septic shock   3. Anemia and thrombocytopenia concerning for possible DIC   4 acute renal failure--ATN in the setting of septic shock  5.  Transminitis secondary to sepsis  6.  Mildly elevated troponin's likely demand ischemia   7.  Diabetes Mellitus  Patient is transitioned to comfort care. Social work consult for hospice home placement--pt on wait list  CODE STATUS: DNR  Patient has a very poor prognosis.  TOTAL TIME TAKING CARE OF THIS PATIENT: 15 minutes.  >50% time spent on counselling and coordination of care    Note: This dictation was prepared with Dragon dictation along with smaller phrase technology. Any transcriptional errors that result from this process are unintentional.  Adeel Guiffre M.D on 10/16/2017 at 7:37 AM  Between 7am to 6pm - Pager - [phone]  After 6pm go to [email or web address] - password [email or web address] [facility]  Office  [phone]  CC: Primary care physician; Crecencio Mc, MD

## 2017-10-16 NOTE — Progress Notes (Signed)
Pt d/c to Hospice. Report called to Magda Bernheim at [facility]. Pt left unit via EMS for d/c.

## 2017-11-06 DEATH — deceased

## 2019-01-03 IMAGING — DX DG CHEST 1V PORT
1 series · 1 of 1 positions shown · non-contrast
Comparison: 12/29/2009 and earlier.

CLINICAL DATA: [AGE] with sepsis.

EXAM:
PORTABLE CHEST 1 VIEW

[chest ap]
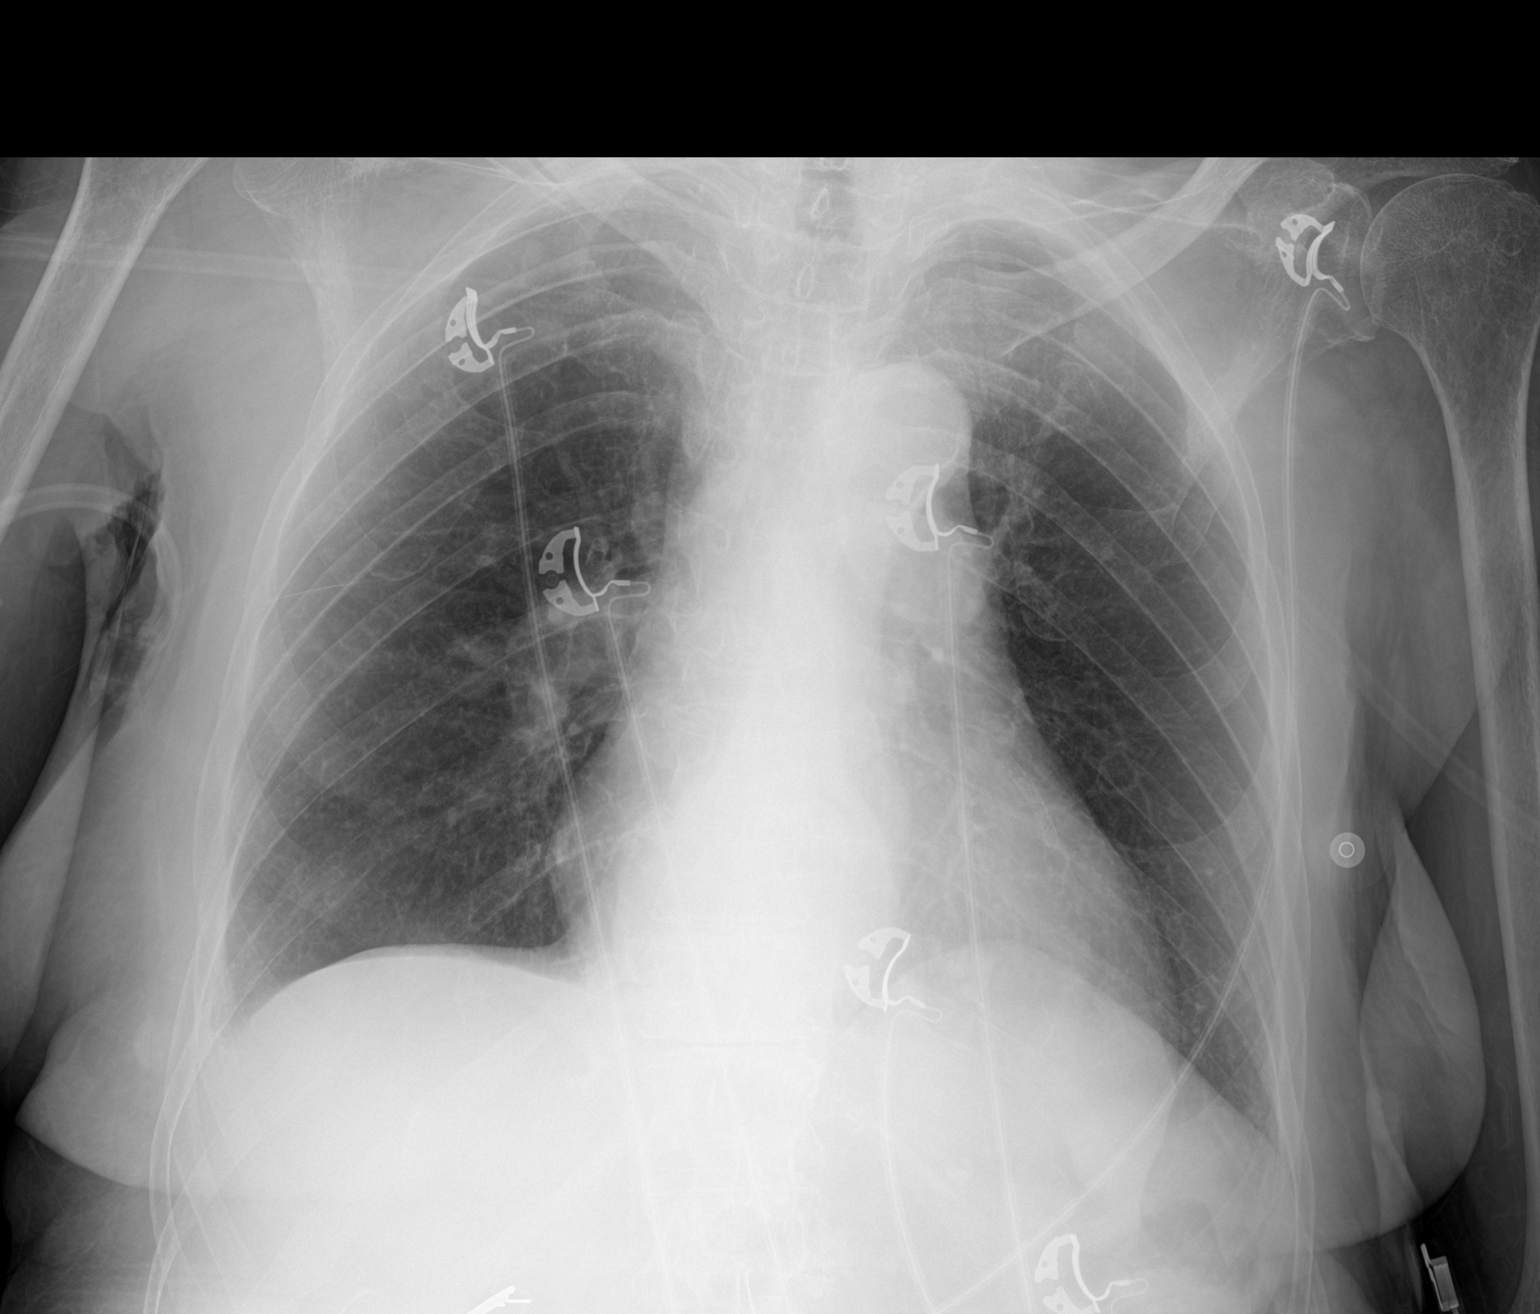

[1 of 1 positions shown; findings below may reference images not displayed]

FINDINGS: Cardiac silhouette mildly enlarged. LAD coronary stent. Thoracic
aorta mildly atherosclerotic, unchanged. Small hiatal hernia. Hilar
and mediastinal contours otherwise unremarkable. Lungs clear.
Bronchovascular markings normal. Pulmonary vascularity normal. No
visible pleural effusions. No pneumothorax.
IMPRESSION: 1. Mild cardiomegaly.  No acute cardiopulmonary disease.
2. Small hiatal hernia.
3.  Aortic Atherosclerosis ([id number]-170.0)
